# Patient Record
Sex: Male | Born: 1950
Health system: Southern US, Community
[De-identification: ages and names within clinical notes are randomized; demographics above are authoritative.]

## PROBLEM LIST (undated history)

## (undated) DIAGNOSIS — E785 Hyperlipidemia, unspecified: Secondary | ICD-10-CM

## (undated) DIAGNOSIS — T782XXA Anaphylactic shock, unspecified, initial encounter: Secondary | ICD-10-CM

## (undated) DIAGNOSIS — C439 Malignant melanoma of skin, unspecified: Secondary | ICD-10-CM

## (undated) HISTORY — DX: Malignant melanoma of skin, unspecified: C43.9

## (undated) HISTORY — PX: MELANOMA EXCISION: SHX5266

## (undated) HISTORY — PX: THYROID SURGERY: SHX805

---

## 2013-02-12 ENCOUNTER — Encounter (HOSPITAL_COMMUNITY): Payer: Self-pay | Admitting: *Deleted

## 2013-02-12 ENCOUNTER — Emergency Department (HOSPITAL_COMMUNITY)
Admission: EM | Admit: 2013-02-12 | Discharge: 2013-02-12 | Disposition: A | Discharge status: Home / Self Care | Payer: BC Managed Care – PPO | Attending: Emergency Medicine | Admitting: Emergency Medicine

## 2013-02-12 DIAGNOSIS — T63461A Toxic effect of venom of wasps, accidental (unintentional), initial encounter: Secondary | ICD-10-CM | POA: Insufficient documentation

## 2013-02-12 DIAGNOSIS — Y929 Unspecified place or not applicable: Secondary | ICD-10-CM | POA: Insufficient documentation

## 2013-02-12 DIAGNOSIS — Y939 Activity, unspecified: Secondary | ICD-10-CM | POA: Insufficient documentation

## 2013-02-12 DIAGNOSIS — Z862 Personal history of diseases of the blood and blood-forming organs and certain disorders involving the immune mechanism: Secondary | ICD-10-CM | POA: Insufficient documentation

## 2013-02-12 DIAGNOSIS — R6889 Other general symptoms and signs: Secondary | ICD-10-CM | POA: Insufficient documentation

## 2013-02-12 DIAGNOSIS — Z8639 Personal history of other endocrine, nutritional and metabolic disease: Secondary | ICD-10-CM | POA: Insufficient documentation

## 2013-02-12 DIAGNOSIS — T7840XA Allergy, unspecified, initial encounter: Secondary | ICD-10-CM

## 2013-02-12 DIAGNOSIS — I10 Essential (primary) hypertension: Secondary | ICD-10-CM | POA: Insufficient documentation

## 2013-02-12 DIAGNOSIS — T783XXA Angioneurotic edema, initial encounter: Secondary | ICD-10-CM | POA: Insufficient documentation

## 2013-02-12 DIAGNOSIS — Z79899 Other long term (current) drug therapy: Secondary | ICD-10-CM | POA: Insufficient documentation

## 2013-02-12 HISTORY — DX: Anaphylactic shock, unspecified, initial encounter: T78.2XXA

## 2013-02-12 HISTORY — DX: Hyperlipidemia, unspecified: E78.5

## 2013-02-12 MED ORDER — DIPHENHYDRAMINE HCL 25 MG PO CAPS
50.0000 mg | ORAL_CAPSULE | Freq: Once | ORAL | Status: AC
  Administered 2013-02-12: 50 mg via ORAL
  Filled 2013-02-12: qty 2

## 2013-02-12 MED ORDER — DIPHENHYDRAMINE HCL 25 MG PO TABS: 50.0000 mg | ORAL_TABLET | Freq: Three times a day (TID) | ORAL | Status: DC

## 2013-02-12 MED ORDER — RANITIDINE HCL 150 MG PO CAPS: 150.0000 mg | ORAL_CAPSULE | Freq: Two times a day (BID) | ORAL | Status: DC

## 2013-02-12 MED ORDER — EPINEPHRINE 0.3 MG/0.3ML IJ DEVI: 0.3000 mg | INTRAMUSCULAR | Status: AC | PRN

## 2013-02-12 MED ORDER — PREDNISONE 50 MG PO TABS: 50.0000 mg | ORAL_TABLET | Freq: Every day | ORAL | Status: DC

## 2013-02-12 MED ORDER — RANITIDINE HCL 150 MG/10ML PO SYRP
150.0000 mg | ORAL_SOLUTION | Freq: Once | ORAL | Status: AC
  Administered 2013-02-12: 150 mg via ORAL
  Filled 2013-02-12 (×2): qty 10

## 2013-02-12 NOTE — ED Provider Notes (Signed)
History     CSN: [id number]  Arrival date & time 02/12/13  0825   First MD Initiated Contact with Patient 02/12/13 [phone]      Chief Complaint  Patient presents with  . Angioedema    (Consider location/radiation/quality/duration/timing/severity/associated sxs/prior treatment) HPI Comments: 57 y M with PMH of anaphylaxis (prior workup by an Allergist with multiple allergens identified, prior Rx for epipen, none now) here for lower lip swelling and subjective throat swelling that started 2 days ago.  He had a bug bite on Saturday with localized itching/redness, but no other known exposures.  Pt is unsure of anything else that could have caused his symptoms.  His only medication is Lipitor and OTC vitamins.  No rash, wheezing, drooling, difficulty swallowing, nausea, vomiting, diarrhea or other complaints.   Patient is a 62 y.o. male presenting with allergic reaction. The history is provided by the patient.  Allergic Reaction The primary symptoms are  angioedema. The primary symptoms do not include wheezing, shortness of breath, abdominal pain, nausea, vomiting, diarrhea, palpitations, rash or urticaria. The current episode started 2 days ago. The problem has been gradually worsening. This is a new problem.  The angioedema is not associated with shortness of breath.  The onset of the reaction was associated with insect bite/sting. Significant symptoms also include itching (in area of insect bite). Significant symptoms that are not present include eye redness or rhinorrhea.    Past Medical History  Diagnosis Date  . Anaphylaxis   . Hyperlipidemia     History reviewed. No pertinent past surgical history.  History reviewed. No pertinent family history.  History  Substance Use Topics  . Smoking status: Never Smoker   . Smokeless tobacco: Never Used  . Alcohol Use: No      Review of Systems  Constitutional: Negative for fever.  HENT: Negative for rhinorrhea and trouble swallowing.    Eyes: Negative for redness.  Respiratory: Negative for shortness of breath and wheezing.   Cardiovascular: Negative for chest pain and palpitations.  Gastrointestinal: Negative for nausea, vomiting, abdominal pain and diarrhea.  Skin: Positive for itching (in area of insect bite). Negative for rash.  All other systems reviewed and are negative.    Allergies  Review of patient's allergies indicates no known allergies.  Home Medications   Current Outpatient Rx  Name  Route  Sig  Dispense  Refill  . diphenhydrAMINE (BENADRYL) 25 MG tablet   Oral   Take 2 tablets (50 mg total) by mouth every 8 (eight) hours. For 3 days   18 tablet   0   . EPINEPHrine (EPIPEN) 0.3 mg/0.3 mL DEVI   Intramuscular   Inject 0.3 mLs (0.3 mg total) into the muscle as needed (anaphylaxis).   2 Device   1   . predniSONE (DELTASONE) 50 MG tablet   Oral   Take 1 tablet (50 mg total) by mouth daily.   5 tablet   0   . ranitidine (ZANTAC) 150 MG capsule   Oral   Take 1 capsule (150 mg total) by mouth 2 (two) times daily. For 3 days   6 capsule   0     BP 133/96  Pulse 71  Temp(Src) 97.5 F (36.4 C) (Oral)  Resp 16  Ht 5\' 9"  (1.753 m)  Wt 170 lb (77.111 kg)  BMI 25.09 kg/m2  SpO2 99%  Physical Exam  Vitals reviewed. Constitutional: He is oriented to person, place, and time. He appears well-developed and well-nourished. No distress.  HENT:  Head: Normocephalic. No trismus in the jaw.  Right Ear: External ear normal.  Left Ear: External ear normal.  Nose: Nose normal.  Mouth/Throat: Uvula is midline and oropharynx is clear and moist. No edematous. No oropharyngeal exudate, posterior oropharyngeal edema or posterior oropharyngeal erythema.    No tongue swelling  Eyes: Conjunctivae and EOM are normal. Pupils are equal, round, and reactive to light.  Neck: Normal range of motion. Neck supple.  Cardiovascular: Normal rate, regular rhythm, normal heart sounds and intact distal pulses.  Exam  reveals no gallop and no friction rub.   No murmur heard. Pulmonary/Chest: Effort normal and breath sounds normal.  Abdominal: Soft. Bowel sounds are normal. He exhibits no distension. There is no tenderness.  Musculoskeletal: Normal range of motion. He exhibits no edema and no tenderness.       Back:  Neurological: He is alert and oriented to person, place, and time. No cranial nerve deficit.  Skin: Skin is warm and dry.  Psychiatric: He has a normal mood and affect.    ED Course  Procedures (including critical care time)  Labs Reviewed - No data to display No results found.   1. Angioedema of lips, initial encounter   2. Allergic reaction, initial encounter   3. Insect sting allergy, current reaction, initial encounter       MDM   7 y M with PMH of anaphylaxis (prior workup by an Allergist with multiple allergens identified, prior Rx for epipen, none now) here for lower lip swelling and subjective throat swelling.  He had a bug bite on Saturday with localized itching/redness, but no other known exposures.  Pt is unsure of anything else that could have caused his symptoms.  His only medication is Lipitor and OTC vitamins.  No rash, wheezing, drooling, difficulty swallowing, nausea, vomiting, diarrhea or other complaints.  AFVSS.  Mild lower lip edemam > on L.  No tongue, uvula, submental or posterior oropharyngeal swelling appreciated.  Lungs clear.  Blanchable erythematous area on left lower back with raised central area c/w insect bite.  Presentation c/w allergic reaction.  Benadryl and Zantac here with Rx's for the same.  Rx for epipen - administration technique and indications discussed.  Pt has a PCP appt next week.  Return precautions reviewed.  It is felt the pt is stable for d/c.  All questions answered and patient expressed understanding.  Disposition: Discharge  Condition: Good      Follow-up Information   Follow up with your doctor as discussed..      Follow up  with [facility] EMERGENCY DEPARTMENT. (If symptoms worsen)    Contact information:   [address] [id number] [place] [address] [phone]        Medication List    TAKE these medications       diphenhydrAMINE 25 MG tablet  Commonly known as:  BENADRYL  Take 2 tablets (50 mg total) by mouth every 8 (eight) hours. For 3 days     EPINEPHrine 0.3 mg/0.3 mL Devi  Commonly known as:  EPIPEN  Inject 0.3 mLs (0.3 mg total) into the muscle as needed (anaphylaxis).     predniSONE 50 MG tablet  Commonly known as:  DELTASONE  Take 1 tablet (50 mg total) by mouth daily.     ranitidine 150 MG capsule  Commonly known as:  ZANTAC  Take 1 capsule (150 mg total) by mouth 2 (two) times daily. For 3 days  Pt seen in conjunction with my attending, Dr. Silverio Lay.   Oleh Genin, MD PGY-II [facility] Emergency Medicine Resident   Oleh Genin, MD 02/12/13 [phone]

## 2013-02-12 NOTE — ED Notes (Signed)
Pt d/c talking in complete sentences. NDN.

## 2013-02-12 NOTE — ED Provider Notes (Signed)
I have supervised the resident on the management of this patient and agree with the note above. I personally interviewed and examined the patient and my addendum is below.   Carl Dunn is a 62 y.o. male hx of anaphylaxis here with lower lip swelling. Had spider bite several days ago. But yesterday had lower lip swelling. Complains of fullness in throat but no throat closing or SOB or drooling. Not stridorous in the ED, soft palate and uvula not swollen. I told him to take benadryl around the clock. If not improved by later today, he can take prednisone for 5 days. Prescribed epipen for anaphylactic symptoms. Return precautions given.    Richardean Canal, MD 02/12/13 1355

## 2013-02-12 NOTE — ED Notes (Signed)
Pt reports that yesterday he began to have lower lip swelling.  No distress noted.  He reports that the swelling has increased to now include his throat (sensation of throat tightness).  He is able to talk in complete sentences.  Resp symmetrical and unlabored.  Skin warm and dry.  Pt does not sound hoarse.  Denies SOB.

## 2013-04-22 ENCOUNTER — Ambulatory Visit (INDEPENDENT_AMBULATORY_CARE_PROVIDER_SITE_OTHER): Payer: Self-pay | Admitting: Surgery

## 2013-04-27 ENCOUNTER — Telehealth (INDEPENDENT_AMBULATORY_CARE_PROVIDER_SITE_OTHER): Payer: Self-pay

## 2013-04-27 NOTE — Telephone Encounter (Signed)
LMOM at pt home # to call to r/s missed appt. LMOM for [place] at [place]. Med. That pt n/s appt.

## 2013-05-01 ENCOUNTER — Encounter (INDEPENDENT_AMBULATORY_CARE_PROVIDER_SITE_OTHER): Payer: Self-pay | Admitting: Surgery

## 2013-07-16 ENCOUNTER — Ambulatory Visit: Payer: BC Managed Care – PPO | Admitting: Internal Medicine

## 2013-08-25 ENCOUNTER — Ambulatory Visit: Payer: BC Managed Care – PPO | Admitting: Internal Medicine

## 2013-12-25 ENCOUNTER — Other Ambulatory Visit (HOSPITAL_COMMUNITY): Payer: Self-pay | Admitting: Urology

## 2013-12-25 DIAGNOSIS — R972 Elevated prostate specific antigen [PSA]: Secondary | ICD-10-CM

## 2013-12-27 DIAGNOSIS — E892 Postprocedural hypoparathyroidism: Secondary | ICD-10-CM | POA: Insufficient documentation

## 2014-01-11 ENCOUNTER — Ambulatory Visit (HOSPITAL_COMMUNITY)
Admission: RE | Admit: 2014-01-11 | Discharge: 2014-01-11 | Disposition: A | Discharge status: Home / Self Care | Payer: BC Managed Care – PPO | Source: Ambulatory Visit | Attending: Urology | Admitting: Urology

## 2014-01-11 DIAGNOSIS — N400 Enlarged prostate without lower urinary tract symptoms: Secondary | ICD-10-CM | POA: Insufficient documentation

## 2014-01-11 DIAGNOSIS — R972 Elevated prostate specific antigen [PSA]: Secondary | ICD-10-CM | POA: Insufficient documentation

## 2014-01-11 DIAGNOSIS — K4090 Unilateral inguinal hernia, without obstruction or gangrene, not specified as recurrent: Secondary | ICD-10-CM | POA: Insufficient documentation

## 2014-01-11 MED ORDER — GADOBENATE DIMEGLUMINE 529 MG/ML IV SOLN
16.0000 mL | Freq: Once | INTRAVENOUS | Status: AC | PRN
  Administered 2014-01-11: 16 mL via INTRAVENOUS

## 2014-01-12 LAB — POCT I-STAT CREATININE: CREATININE: 1.3 mg/dL (ref 0.50–1.35)

## 2015-02-08 DIAGNOSIS — W57XXXA Bitten or stung by nonvenomous insect and other nonvenomous arthropods, initial encounter: Secondary | ICD-10-CM | POA: Insufficient documentation

## 2015-02-08 DIAGNOSIS — E042 Nontoxic multinodular goiter: Secondary | ICD-10-CM | POA: Insufficient documentation

## 2015-02-08 DIAGNOSIS — M8580 Other specified disorders of bone density and structure, unspecified site: Secondary | ICD-10-CM | POA: Insufficient documentation

## 2015-02-08 DIAGNOSIS — E213 Hyperparathyroidism, unspecified: Secondary | ICD-10-CM | POA: Insufficient documentation

## 2015-06-09 ENCOUNTER — Other Ambulatory Visit: Payer: Self-pay | Admitting: Urology

## 2015-06-09 DIAGNOSIS — E291 Testicular hypofunction: Secondary | ICD-10-CM

## 2015-07-08 ENCOUNTER — Other Ambulatory Visit: Payer: Self-pay | Admitting: Urology

## 2015-07-08 DIAGNOSIS — D751 Secondary polycythemia: Secondary | ICD-10-CM

## 2015-08-02 ENCOUNTER — Encounter (HOSPITAL_COMMUNITY): Admission: RE | Admit: 2015-08-02 | Payer: BLUE CROSS/BLUE SHIELD | Source: Ambulatory Visit

## 2015-09-15 ENCOUNTER — Emergency Department (HOSPITAL_COMMUNITY)
Admission: EM | Admit: 2015-09-15 | Discharge: 2015-09-15 | Disposition: A | Discharge status: Home / Self Care | Payer: BLUE CROSS/BLUE SHIELD | Attending: Emergency Medicine | Admitting: Emergency Medicine

## 2015-09-15 ENCOUNTER — Encounter (HOSPITAL_COMMUNITY): Payer: Self-pay | Admitting: Emergency Medicine

## 2015-09-15 DIAGNOSIS — Y998 Other external cause status: Secondary | ICD-10-CM | POA: Diagnosis not present

## 2015-09-15 DIAGNOSIS — Z7952 Long term (current) use of systemic steroids: Secondary | ICD-10-CM | POA: Insufficient documentation

## 2015-09-15 DIAGNOSIS — Y9289 Other specified places as the place of occurrence of the external cause: Secondary | ICD-10-CM | POA: Insufficient documentation

## 2015-09-15 DIAGNOSIS — Y9301 Activity, walking, marching and hiking: Secondary | ICD-10-CM | POA: Diagnosis not present

## 2015-09-15 DIAGNOSIS — Z79899 Other long term (current) drug therapy: Secondary | ICD-10-CM | POA: Diagnosis not present

## 2015-09-15 DIAGNOSIS — E785 Hyperlipidemia, unspecified: Secondary | ICD-10-CM | POA: Insufficient documentation

## 2015-09-15 DIAGNOSIS — W228XXA Striking against or struck by other objects, initial encounter: Secondary | ICD-10-CM | POA: Insufficient documentation

## 2015-09-15 DIAGNOSIS — Z23 Encounter for immunization: Secondary | ICD-10-CM | POA: Diagnosis not present

## 2015-09-15 DIAGNOSIS — S0501XA Injury of conjunctiva and corneal abrasion without foreign body, right eye, initial encounter: Secondary | ICD-10-CM | POA: Diagnosis not present

## 2015-09-15 DIAGNOSIS — S0591XA Unspecified injury of right eye and orbit, initial encounter: Secondary | ICD-10-CM | POA: Diagnosis present

## 2015-09-15 MED ORDER — POLYMYXIN B-TRIMETHOPRIM 10000-0.1 UNIT/ML-% OP SOLN
1.0000 [drp] | OPHTHALMIC | Status: DC
  Administered 2015-09-15: 1 [drp] via OPHTHALMIC
  Filled 2015-09-15: qty 10

## 2015-09-15 MED ORDER — FLUORESCEIN SODIUM 1 MG OP STRP
1.0000 | ORAL_STRIP | Freq: Once | OPHTHALMIC | Status: AC
  Administered 2015-09-15: 1 via OPHTHALMIC
  Filled 2015-09-15: qty 1

## 2015-09-15 MED ORDER — TETANUS-DIPHTH-ACELL PERTUSSIS 5-2.5-18.5 LF-MCG/0.5 IM SUSP
0.5000 mL | Freq: Once | INTRAMUSCULAR | Status: AC
  Administered 2015-09-15: 0.5 mL via INTRAMUSCULAR
  Filled 2015-09-15: qty 0.5

## 2015-09-15 MED ORDER — IBUPROFEN 400 MG PO TABS: 400.0000 mg | ORAL_TABLET | Freq: Four times a day (QID) | ORAL | Status: DC | PRN

## 2015-09-15 MED ORDER — TETRACAINE HCL 0.5 % OP SOLN
2.0000 [drp] | Freq: Once | OPHTHALMIC | Status: AC
  Administered 2015-09-15: 2 [drp] via OPHTHALMIC
  Filled 2015-09-15: qty 2

## 2015-09-15 NOTE — ED Notes (Signed)
See PA note.

## 2015-09-15 NOTE — Discharge Instructions (Signed)
Apply 1-2 drops of antibiotic eye drop to the affected eye every 4-6 hrs while awake for the next 5 days.  Follow up with eye specialist if you have any concerns.  Take ibuprofen as needed for pain.  Corneal Abrasion The cornea is the clear covering at the front and center of the eye. When looking at the colored portion of the eye (iris), you are looking through the cornea. This very thin tissue is made up of many layers. The surface layer is a single layer of cells (corneal epithelium) and is one of the most sensitive tissues in the body. If a scratch or injury causes the corneal epithelium to come off, it is called a corneal abrasion. If the injury extends to the tissues below the epithelium, the condition is called a corneal ulcer. CAUSES   Scratches.  Trauma.  Foreign body in the eye. Some people have recurrences of abrasions in the area of the original injury even after it has healed (recurrent erosion syndrome). Recurrent erosion syndrome generally improves and goes away with time. SYMPTOMS   Eye pain.  Difficulty or inability to keep the injured eye open.  The eye becomes very sensitive to light.  Recurrent erosions tend to happen suddenly, first thing in the morning, usually after waking up and opening the eye. DIAGNOSIS  Your health care provider can diagnose a corneal abrasion during an eye exam. Dye is usually placed in the eye using a drop or a small paper strip moistened by your tears. When the eye is examined with a special light, the abrasion shows up clearly because of the dye. TREATMENT   Small abrasions may be treated with antibiotic drops or ointment alone.  A pressure patch may be put over the eye. If this is done, follow your doctor's instructions for when to remove the patch. Do not drive or use machines while the eye patch is on. Judging distances is hard to do with a patch on. If the abrasion becomes infected and spreads to the deeper tissues of the cornea, a  corneal ulcer can result. This is serious because it can cause corneal scarring. Corneal scars interfere with light passing through the cornea and cause a loss of vision in the involved eye. HOME CARE INSTRUCTIONS  Use medicine or ointment as directed. Only take over-the-counter or prescription medicines for pain, discomfort, or fever as directed by your health care provider.  Do not drive or operate machinery if your eye is patched. Your ability to judge distances is impaired.  If your health care provider has given you a follow-up appointment, it is very important to keep that appointment. Not keeping the appointment could result in a severe eye infection or permanent loss of vision. If there is any problem keeping the appointment, let your health care provider know. SEEK MEDICAL CARE IF:   You have pain, light sensitivity, and a scratchy feeling in one eye or both eyes.  Your pressure patch keeps loosening up, and you can blink your eye under the patch after treatment.  Any kind of discharge develops from the eye after treatment or if the lids stick together in the morning.  You have the same symptoms in the morning as you did with the original abrasion days, weeks, or months after the abrasion healed.   This information is not intended to replace advice given to you by your health care provider. Make sure you discuss any questions you have with your health care provider.   Document Released:  10/05/2000 Document Revised: 06/29/2015 Document Reviewed: 06/15/2013 Elsevier Interactive Patient Education [employer].

## 2015-09-15 NOTE — ED Notes (Signed)
Pt from home, states he was walking a dog when he walked into a branch from a tree and hit his eye. Pt states he kept blinking and vision became more clear but reports that something is in his eye. Pt with full movement to eyeball. nad noted.

## 2015-09-15 NOTE — ED Provider Notes (Signed)
CSN: [id number]     Arrival date & time 09/15/15  1044 History  By signing my name below, I, Eustaquio Maize, attest that this documentation has been prepared under the direction and in the presence of Domenic Moras, PA-C. Electronically Signed: Eustaquio Maize, ED Scribe. 09/15/2015. 11:09 AM.  No chief complaint on file.  The history is provided by the patient. No language interpreter was used.     HPI Comments: Carl Dunn is a 64 y.o. male who presents to the Emergency Department complaining of sudden onset right eye injury that occurred this morning. Pt was walking his dog outside and was not wearing his glasses when he walked into a branch. He notes immediate blurry vision to his right eye and a sensation that something was in it. Pt washed his eye out with water which mildly alleviated the blurry vision. He still feels like these is something in it but states it feel better than it did prior to washing it out. Pt reports only mild discomfort to the right eye but denies any pain. Denies loss of vision or any other associated symptoms. Pt is non contact wearer. Tetanus not up to date.   Past Medical History  Diagnosis Date  . Anaphylaxis   . Hyperlipidemia    No past surgical history on file. No family history on file. Social History  Substance Use Topics  . Smoking status: Never Smoker   . Smokeless tobacco: Never Used  . Alcohol Use: No    Review of Systems  Eyes: Positive for visual disturbance (Blurry vision). Negative for pain and discharge.       + Right eye discomfort  Neurological: Negative for dizziness and light-headedness.    Allergies  Review of patient's allergies indicates no known allergies.  Home Medications   Prior to Admission medications   Medication Sig Start Date End Date Taking? Authorizing Provider  Ascorbic Acid (VITAMIN C PO) Take 1 tablet by mouth daily.    Historical Provider, MD  atorvastatin (LIPITOR) 20 MG tablet Take 20 mg by mouth every  evening.    Historical Provider, MD  B Complex Vitamins (VITAMIN B COMPLEX PO) Take 1 tablet by mouth daily.    Historical Provider, MD  Cholecalciferol (VITAMIN D PO) Take 1 tablet by mouth daily.    Historical Provider, MD  co-enzyme Q-10 30 MG capsule Take 30 mg by mouth daily.    Historical Provider, MD  Cyanocobalamin (VITAMIN B-12 PO) Take 1 tablet by mouth daily.    Historical Provider, MD  diphenhydrAMINE (BENADRYL) 25 MG tablet Take 2 tablets (50 mg total) by mouth every 8 (eight) hours. For 3 days 02/12/13   Madaline Brilliant, MD  EPINEPHrine (EPIPEN) 0.3 mg/0.3 mL DEVI Inject 0.3 mLs (0.3 mg total) into the muscle as needed (anaphylaxis). 02/12/13   Madaline Brilliant, MD  levothyroxine (SYNTHROID, LEVOTHROID) 50 MCG tablet Take 50 mcg by mouth daily before breakfast.    Historical Provider, MD  MAGNESIUM PO Take 1 tablet by mouth daily.    Historical Provider, MD  Multiple Vitamins-Minerals (ZINC PO) Take 1 tablet by mouth daily.    Historical Provider, MD  Omega-3 Fatty Acids (OMEGA 3 PO) Take 1 capsule by mouth daily.    Historical Provider, MD  predniSONE (DELTASONE) 50 MG tablet Take 1 tablet (50 mg total) by mouth daily. 02/12/13   Madaline Brilliant, MD  ranitidine (ZANTAC) 150 MG capsule Take 1 capsule (150 mg total) by mouth 2 (two) times daily. For 3 days 02/12/13  Madaline Brilliant, MD   Triage Vitals: BP 110/80 mmHg  Pulse 87  Temp(Src) 97.7 F (36.5 C) (Oral)  Resp 16  Ht 5\' 10"  (1.778 m)  Wt 195 lb (88.451 kg)  BMI 27.98 kg/m2  SpO2 96%   Physical Exam  Constitutional: He is oriented to person, place, and time. He appears well-developed and well-nourished. No distress.  HENT:  Head: Normocephalic and atraumatic.  Eyes: EOM and lids are normal. Pupils are equal, round, and reactive to light. Lids are everted and swept, no foreign bodies found. Right eye exhibits no chemosis, no discharge, no exudate and no hordeolum. No foreign body present in the right eye. Right conjunctiva is injected.   Slit lamp exam:      The right eye shows corneal abrasion and fluorescein uptake. The right eye shows no corneal flare, no corneal ulcer, no foreign body, no hyphema and no hypopyon.    Right lower eyelid has a small abrasion noted No obvious foreign object  Neck: Neck supple. No tracheal deviation present.  Cardiovascular: Normal rate.   Pulmonary/Chest: Effort normal. No respiratory distress.  Musculoskeletal: Normal range of motion.  Neurological: He is alert and oriented to person, place, and time.  Skin: Skin is warm and dry.  Psychiatric: He has a normal mood and affect. His behavior is normal.  Nursing note and vitals reviewed.   ED Course  Procedures (including critical care time)  DIAGNOSTIC STUDIES: Oxygen Saturation is 96% on RA, normal by my interpretation.    COORDINATION OF CARE: 11:06 AM-Discussed treatment plan which includes tetracaine eye drops and fluorescein strip test with pt at bedside and pt agreed to plan.   R eye injury.  Evidence of corneal abrasion. Normal visual acuity.  Ocular [place] without evidence of fb or retinal detachment. Will update tdap, give polytrim, provide eye care instruction.    EMERGENCY DEPARTMENT [place] OCULAR EXAM "Study: Limited Ultrasound of Orbit "  INDICATIONS: Traumatic  Linear probe utilized to obtain images in both long and short axis of the orbit having the patient look left and right if possible.  PERFORMED BY: Myself  IMAGES ARCHIVED?: Yes  LIMITATIONS: none  VIEWS USED: Right orbit  INTERPRETATION: No retinal detachment, Lens in proper position   Labs Review Labs Reviewed - No data to display  Imaging Review No results found.    EKG Interpretation None      MDM   Final diagnoses:  Corneal abrasion, right, initial encounter   BP 110/80 mmHg  Pulse 87  Temp(Src) 97.7 F (36.5 C) (Oral)  Resp 16  Ht 5\' 10"  (1.778 m)  Wt 88.451 kg  BMI 27.98 kg/m2  SpO2 96%   I personally performed the services  described in this documentation, which was scribed in my presence. The recorded information has been reviewed and is accurate.        Domenic Moras, PA-C 09/15/15 Ryan, MD 09/15/15 1340

## 2015-09-28 DIAGNOSIS — D3131 Benign neoplasm of right choroid: Secondary | ICD-10-CM | POA: Insufficient documentation

## 2016-10-18 ENCOUNTER — Institutional Professional Consult (permissible substitution): Payer: BLUE CROSS/BLUE SHIELD | Admitting: Internal Medicine

## 2016-10-23 ENCOUNTER — Encounter: Payer: Self-pay | Admitting: Cardiology

## 2016-10-23 ENCOUNTER — Ambulatory Visit (INDEPENDENT_AMBULATORY_CARE_PROVIDER_SITE_OTHER): Payer: BLUE CROSS/BLUE SHIELD | Admitting: Cardiology

## 2016-10-23 VITALS — BP 104/90 | HR 74 | Ht 70.0 in | Wt 203.8 lb

## 2016-10-23 DIAGNOSIS — Z79899 Other long term (current) drug therapy: Secondary | ICD-10-CM | POA: Diagnosis not present

## 2016-10-23 DIAGNOSIS — R000 Tachycardia, unspecified: Secondary | ICD-10-CM | POA: Diagnosis not present

## 2016-10-23 NOTE — Patient Instructions (Signed)
Medication Instructions: Your physician recommends that you continue on your current medications as directed. Please refer to the Current Medication list given to you today.   Labwork: Your physician recommends that you have lab work : CBC and BMET   Procedures/Testing: None Ordered  Follow-Up: Your physician wants you to follow-up in: 6 MONTHS with Dr. Curt Bears. You will receive a reminder letter in the mail two months in advance. If you don't receive a letter, please call our office to schedule the follow-up appointment.   Any Additional Special Instructions Will Be Listed Below (If Applicable).     If you need a refill on your cardiac medications before your next appointment, please call your pharmacy.

## 2016-10-23 NOTE — Progress Notes (Signed)
Electrophysiology Office Note   Date:  10/23/2016   ID:  Carl Dunn, DOB 05/10/1951, MRN [id number]  PCP:  No PCP Per Patient  Primary Electrophysiologist:  Willodean Leven Meredith Leeds, MD    Chief Complaint  Patient presents with  . Palpitations    History of Present Illness: Carl Dunn is a 66 y.o. male who presents today for electrophysiology evaluation.   He has a history of hyperlipidemia, Hyperparathyroidism status post resection. He was on a bike riding in [place] on December 27. After the bike ride, he says that he developed episodes of palpitations. He waited 2 hours prior to going to the emergency room and tried multiple maneuvers such as lying down and elevating his legs. He had associated dizziness and nausea. Once he got into the emergency room, he was given 6 mg of adenosine which resulted in cessation of his tachycardia. He has had one other episode of tachycardia in the past that was not as long lived. He otherwise has felt well without any complaint. Further episodes.   Today, he denies symptoms of palpitations, chest pain, shortness of breath, orthopnea, PND, lower extremity edema, claudication, dizziness, presyncope, syncope, bleeding, or neurologic sequela. The patient is tolerating medications without difficulties and is otherwise without complaint today.    Past Medical History:  Diagnosis Date  . Anaphylaxis   . Hyperlipidemia    Past Surgical History:  Procedure Laterality Date  . THYROID SURGERY       Current Outpatient Prescriptions  Medication Sig Dispense Refill  . Ascorbic Acid (VITAMIN C PO) Take 1 tablet by mouth daily.    Marland Kitchen atorvastatin (LIPITOR) 20 MG tablet Take 20 mg by mouth every evening.    . B Complex Vitamins (VITAMIN B COMPLEX PO) Take 1 tablet by mouth daily.    . Cholecalciferol (VITAMIN D PO) Take 1 tablet by mouth daily.    Marland Kitchen co-enzyme Q-10 30 MG capsule Take 30 mg by mouth daily.    . Cyanocobalamin (VITAMIN B-12 PO) Take 1 tablet  by mouth daily.    Marland Kitchen EPINEPHrine (EPIPEN) 0.3 mg/0.3 mL DEVI Inject 0.3 mLs (0.3 mg total) into the muscle as needed (anaphylaxis). 2 Device 1  . MAGNESIUM PO Take 1 tablet by mouth daily.    . Multiple Vitamins-Minerals (ZINC PO) Take 1 tablet by mouth daily.    . Omega-3 Fatty Acids (OMEGA 3 PO) Take 1 capsule by mouth daily.     No current facility-administered medications for this visit.     Allergies:   Patient has no known allergies.   Social History:  The patient  reports that he has never smoked. He has never used smokeless tobacco. He reports that he does not drink alcohol or use drugs.   Family History:  The patient's family history includes Colon cancer in his father; Heart attack in his paternal grandfather; Hyperlipidemia in his father; Hypoparathyroidism in his mother.    ROS:  Please see the history of present illness.   Otherwise, review of systems is positive for patient's, dyspnea on exertion.   All other systems are reviewed and negative.    PHYSICAL EXAM: VS:  BP 104/90   Pulse 74   Ht 5\' 10"  (1.778 m)   Wt 203 lb 12.8 oz (92.4 kg)   SpO2 98%   BMI 29.24 kg/m  , BMI Body mass index is 29.24 kg/m. GEN: Well nourished, well developed, in no acute distress  HEENT: normal  Neck: no JVD, carotid bruits, or masses  Cardiac: RRR; no murmurs, rubs, or gallops,no edema  Respiratory:  clear to auscultation bilaterally, normal work of breathing GI: soft, nontender, nondistended, + BS MS: no deformity or atrophy  Skin: warm and dry Neuro:  Strength and sensation are intact Psych: euthymic mood, full affect  EKG:  EKG is ordered today. Personal review of the ekg ordered shows sinus rhythm first. AV block, PR 216  Recent Labs: No results found for requested labs within last 8760 hours.    Lipid Panel  No results found for: CHOL, TRIG, HDL, CHOLHDL, VLDL, LDLCALC, LDLDIRECT   Wt Readings from Last 3 Encounters:  10/23/16 203 lb 12.8 oz (92.4 kg)  09/15/15 195  lb (88.5 kg)  02/12/13 170 lb (77.1 kg)      Other studies Reviewed: Additional studies/ records that were reviewed today include: ER ECGs  Review of the above records today demonstrates:  SVT rate 156 short RP tachycardia   ASSESSMENT AND PLAN:  1.  SVT: Responded to adenosine 6 mg. Not had any further episodes of tachycardia. 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 We did discuss options of therapy including medications as well as ablation. He says that since he has been feeling well without major complaint, he would prefer to have a watchful waiting approach. I have told him of vagal maneuvers that may help to treat his tachycardia.  2. Hyperlipidemia: Continue atorvastatin  3. Elevated creatinine, elevated white blood cell count: His labs were abnormal when he was in the emergency room in [place]. We Lorriane Dehart recheck today and referred to primary care if remains abnormal.   Current medicines are reviewed at length with the patient today.   The patient does not have concerns regarding his medicines.  The following changes were made today:  none  Labs/ tests ordered today include:  Orders Placed This Encounter  Procedures  . CBC w/Diff  . Basic Metabolic Panel (BMET)  . EKG 12-Lead     Disposition:   FU with Kanen Mottola 6 months  Signed, Jase Himmelberger Meredith Leeds, MD  10/23/2016 9:38 AM     [facility] [address] [place] [address] [phone] (office) [phone] (fax)

## 2016-10-24 LAB — BASIC METABOLIC PANEL
BUN/Creatinine Ratio: 15 (ref 10–24)
BUN: 18 mg/dL (ref 8–27)
CALCIUM: 9 mg/dL (ref 8.6–10.2)
CO2: 23 mmol/L (ref 18–29)
CREATININE: 1.22 mg/dL (ref 0.76–1.27)
Chloride: 104 mmol/L (ref 96–106)
GFR, EST AFRICAN AMERICAN: 72 mL/min/{1.73_m2} (ref >59)
GFR, EST NON AFRICAN AMERICAN: 62 mL/min/{1.73_m2} (ref >59)
Glucose: 99 mg/dL (ref 65–99)
POTASSIUM: 4.7 mmol/L (ref 3.5–5.2)
Sodium: 142 mmol/L (ref 134–144)

## 2016-10-24 LAB — CBC WITH DIFFERENTIAL/PLATELET
Basophils Absolute: 0 10*3/uL (ref 0.0–0.2)
Basos: 1 %
EOS (ABSOLUTE): 0.2 10*3/uL (ref 0.0–0.4)
EOS: 2 %
HEMATOCRIT: 49 % (ref 37.5–51.0)
HEMOGLOBIN: 16.5 g/dL (ref 13.0–17.7)
IMMATURE GRANULOCYTES: 0 %
Immature Grans (Abs): 0 10*3/uL (ref 0.0–0.1)
Lymphocytes Absolute: 2.9 10*3/uL (ref 0.7–3.1)
Lymphs: 33 %
MCH: 31.1 pg (ref 26.6–33.0)
MCHC: 33.7 g/dL (ref 31.5–35.7)
MCV: 93 fL (ref 79–97)
MONOCYTES: 10 %
MONOS ABS: 0.9 10*3/uL (ref 0.1–0.9)
Neutrophils Absolute: 4.8 10*3/uL (ref 1.4–7.0)
Neutrophils: 54 %
Platelets: 340 10*3/uL (ref 150–379)
RBC: 5.3 x10E6/uL (ref 4.14–5.80)
RDW: 13.3 % (ref 12.3–15.4)
WBC: 8.9 10*3/uL (ref 3.4–10.8)

## 2017-05-15 ENCOUNTER — Ambulatory Visit (HOSPITAL_COMMUNITY): Payer: BLUE CROSS/BLUE SHIELD | Admitting: Psychiatry

## 2017-07-15 ENCOUNTER — Encounter: Payer: Self-pay | Admitting: Cardiology

## 2017-07-15 ENCOUNTER — Encounter (INDEPENDENT_AMBULATORY_CARE_PROVIDER_SITE_OTHER): Payer: Self-pay

## 2017-07-15 ENCOUNTER — Ambulatory Visit (INDEPENDENT_AMBULATORY_CARE_PROVIDER_SITE_OTHER): Payer: BLUE CROSS/BLUE SHIELD | Admitting: Cardiology

## 2017-07-15 VITALS — BP 110/78 | HR 94 | Ht 70.0 in | Wt 198.4 lb

## 2017-07-15 DIAGNOSIS — I471 Supraventricular tachycardia: Secondary | ICD-10-CM

## 2017-07-15 DIAGNOSIS — E782 Mixed hyperlipidemia: Secondary | ICD-10-CM | POA: Diagnosis not present

## 2017-07-15 MED ORDER — DILTIAZEM HCL 30 MG PO TABS: 30.0000 mg | ORAL_TABLET | Freq: Four times a day (QID) | ORAL | 1 refills | Status: DC | PRN

## 2017-07-15 NOTE — Progress Notes (Signed)
Electrophysiology Office Note   Date:  07/15/2017   ID:  Dash Cardarelli, DOB 1951/05/05, MRN [id number]  PCP:  Patient, No Pcp Per  Primary Electrophysiologist:  Lillian Ballester Meredith Leeds, MD    Chief Complaint  Patient presents with  . Follow-up    SVT    History of Present Illness: Odai Wimmer is a 66 y.o. male who presents today for electrophysiology evaluation.   He has a history of hyperlipidemia, Hyperparathyroidism status post resection. He was on a bike riding in [place] on December 27. After the bike ride, he says that he developed episodes of palpitations. He waited 2 hours prior to going to the emergency room and tried multiple maneuvers such as lying down and elevating his legs. He had associated dizziness and nausea. Once he got into the emergency room, he was given 6 mg of adenosine which resulted in cessation of his tachycardia. He presents today for follow-up of his SVT.  Today, denies symptoms of palpitations, chest pain, shortness of breath, orthopnea, PND, lower extremity edema, claudication, dizziness, presyncope, syncope, bleeding, or neurologic sequela. The patient is tolerating medications without difficulties. He has had one further episode of SVT that he says lasted approximately 30 minutes. He felt well other than the palpitations during this episode.    Past Medical History:  Diagnosis Date  . Anaphylaxis   . Hyperlipidemia    Past Surgical History:  Procedure Laterality Date  . THYROID SURGERY       Current Outpatient Prescriptions  Medication Sig Dispense Refill  . Ascorbic Acid (VITAMIN C PO) Take 1 tablet by mouth daily.    Marland Kitchen atorvastatin (LIPITOR) 20 MG tablet Take 20 mg by mouth every evening.    . B Complex Vitamins (VITAMIN B COMPLEX PO) Take 1 tablet by mouth daily.    . Cholecalciferol (VITAMIN D PO) Take 1 tablet by mouth daily.    Marland Kitchen co-enzyme Q-10 30 MG capsule Take 30 mg by mouth daily.    . Cyanocobalamin (VITAMIN B-12 PO) Take 1 tablet  by mouth daily.    Marland Kitchen EPINEPHrine (EPIPEN) 0.3 mg/0.3 mL DEVI Inject 0.3 mLs (0.3 mg total) into the muscle as needed (anaphylaxis). 2 Device 1  . MAGNESIUM PO Take 1 tablet by mouth daily.    . Multiple Vitamins-Minerals (ZINC PO) Take 1 tablet by mouth daily.    . Omega-3 Fatty Acids (OMEGA 3 PO) Take 1 capsule by mouth daily.     No current facility-administered medications for this visit.     Allergies:   Patient has no known allergies.   Social History:  The patient  reports that he has never smoked. He has never used smokeless tobacco. He reports that he does not drink alcohol or use drugs.   Family History:  The patient's family history includes Colon cancer in his father; Heart attack in his paternal grandfather; Hyperlipidemia in his father; Hypoparathyroidism in his mother.    ROS:  Please see the history of present illness.   Otherwise, review of systems is positive for Cough, palpitations, joint swelling.   All other systems are reviewed and negative.   PHYSICAL EXAM: VS:  BP 110/78   Pulse 94   Ht 5\' 10"  (1.778 m)   Wt 198 lb 6.4 oz (90 kg)   SpO2 98%   BMI 28.47 kg/m  , BMI Body mass index is 28.47 kg/m. GEN: Well nourished, well developed, in no acute distress  HEENT: normal  Neck: no JVD, carotid bruits, or  masses Cardiac: RRR; no murmurs, rubs, or gallops,no edema  Respiratory:  clear to auscultation bilaterally, normal work of breathing GI: soft, nontender, nondistended, + BS MS: no deformity or atrophy  Skin: warm and dry Neuro:  Strength and sensation are intact Psych: euthymic mood, full affect  EKG:  EKG is not ordered today. Personal review of the ekg ordered 10/23/16 shows sinus rhythm, first-degree AV block   Recent Labs: 10/23/2016: BUN 18; Creatinine, Ser 1.22; Hemoglobin 16.5; Platelets 340; Potassium 4.7; Sodium 142    Lipid Panel  No results found for: CHOL, TRIG, HDL, CHOLHDL, VLDL, LDLCALC, LDLDIRECT   Wt Readings from Last 3 Encounters:    07/15/17 198 lb 6.4 oz (90 kg)  10/23/16 203 lb 12.8 oz (92.4 kg)  09/15/15 195 lb (88.5 kg)      Other studies Reviewed: Additional studies/ records that were reviewed today include: ER ECGs  Review of the above records today demonstrates:  SVT rate 156 short RP tachycardia   ASSESSMENT AND PLAN:  1.  SVT: Has had tachycardia that responded to adenosine. Has had multiple episodes, one since hospitalization in [place]. Due to his episodes of tachycardia sporadically, [facility] plan to give him as needed diltiazem 30 mg.  2. Hyperlipidemia: Continue atorvastatin   Current medicines are reviewed at length with the patient today.   The patient does not have concerns regarding his medicines.  The following changes were made today:  diltiazem  Labs/ tests ordered today include:  No orders of the defined types were placed in this encounter.    Disposition:   FU with [facility] 12 months  Signed, Aneyah Lortz Meredith Leeds, MD  07/15/2017 12:16 PM     [address] [place]  [id number] [phone] (office) [phone] (fax)

## 2017-07-15 NOTE — Patient Instructions (Signed)
Medication Instructions:  Your physician has recommended you make the following change in your medication:  1. Diltiazem 30 mg as needed for palpitations.  You may take this up to 4 times daily.  -- If you need a refill on your cardiac medications before your next appointment, please call your pharmacy. --  Labwork: None ordered  Testing/Procedures: None ordered  Follow-Up: Your physician wants you to follow-up in: 1 year with Dr. Curt Bears.  You will receive a reminder letter in the mail two months in advance. If you don't receive a letter, please call our office to schedule the follow-up appointment.  Thank you for choosing [facility]!!   Trinidad Curet, RN [phone]

## 2018-02-10 DIAGNOSIS — Z800 Family history of malignant neoplasm of digestive organs: Secondary | ICD-10-CM | POA: Insufficient documentation

## 2018-02-10 DIAGNOSIS — Z86010 Personal history of colonic polyps: Secondary | ICD-10-CM | POA: Insufficient documentation

## 2018-09-24 DIAGNOSIS — Z85820 Personal history of malignant melanoma of skin: Secondary | ICD-10-CM | POA: Insufficient documentation

## 2018-10-03 ENCOUNTER — Other Ambulatory Visit: Payer: Self-pay | Admitting: Internal Medicine

## 2018-10-09 ENCOUNTER — Ambulatory Visit
Admission: RE | Admit: 2018-10-09 | Discharge: 2018-10-09 | Disposition: A | Discharge status: Home / Self Care | Payer: BLUE CROSS/BLUE SHIELD | Source: Ambulatory Visit | Attending: Internal Medicine | Admitting: Internal Medicine

## 2018-10-16 ENCOUNTER — Ambulatory Visit
Admission: RE | Admit: 2018-10-16 | Discharge: 2018-10-16 | Disposition: A | Discharge status: Home / Self Care | Payer: BLUE CROSS/BLUE SHIELD | Source: Ambulatory Visit | Attending: Internal Medicine | Admitting: Internal Medicine

## 2019-04-14 DIAGNOSIS — E291 Testicular hypofunction: Secondary | ICD-10-CM | POA: Diagnosis not present

## 2019-04-21 DIAGNOSIS — N400 Enlarged prostate without lower urinary tract symptoms: Secondary | ICD-10-CM | POA: Diagnosis not present

## 2019-04-21 DIAGNOSIS — Z87442 Personal history of urinary calculi: Secondary | ICD-10-CM | POA: Diagnosis not present

## 2019-04-21 DIAGNOSIS — E291 Testicular hypofunction: Secondary | ICD-10-CM | POA: Diagnosis not present

## 2019-05-06 DIAGNOSIS — H5213 Myopia, bilateral: Secondary | ICD-10-CM | POA: Diagnosis not present

## 2019-05-06 DIAGNOSIS — H25013 Cortical age-related cataract, bilateral: Secondary | ICD-10-CM | POA: Diagnosis not present

## 2019-05-06 DIAGNOSIS — H2513 Age-related nuclear cataract, bilateral: Secondary | ICD-10-CM | POA: Diagnosis not present

## 2019-05-06 DIAGNOSIS — H524 Presbyopia: Secondary | ICD-10-CM | POA: Diagnosis not present

## 2019-05-13 DIAGNOSIS — Z85820 Personal history of malignant melanoma of skin: Secondary | ICD-10-CM | POA: Diagnosis not present

## 2019-05-13 DIAGNOSIS — L812 Freckles: Secondary | ICD-10-CM | POA: Diagnosis not present

## 2019-05-13 DIAGNOSIS — Z85828 Personal history of other malignant neoplasm of skin: Secondary | ICD-10-CM | POA: Diagnosis not present

## 2019-05-13 DIAGNOSIS — L821 Other seborrheic keratosis: Secondary | ICD-10-CM | POA: Diagnosis not present

## 2019-05-13 DIAGNOSIS — D3617 Benign neoplasm of peripheral nerves and autonomic nervous system of trunk, unspecified: Secondary | ICD-10-CM | POA: Diagnosis not present

## 2019-05-13 DIAGNOSIS — D235 Other benign neoplasm of skin of trunk: Secondary | ICD-10-CM | POA: Diagnosis not present

## 2019-06-11 DIAGNOSIS — I700 Atherosclerosis of aorta: Secondary | ICD-10-CM | POA: Diagnosis not present

## 2019-06-11 DIAGNOSIS — J984 Other disorders of lung: Secondary | ICD-10-CM | POA: Diagnosis not present

## 2019-06-11 DIAGNOSIS — R772 Abnormality of alphafetoprotein: Secondary | ICD-10-CM | POA: Diagnosis not present

## 2019-06-11 DIAGNOSIS — R7989 Other specified abnormal findings of blood chemistry: Secondary | ICD-10-CM | POA: Diagnosis not present

## 2019-06-11 DIAGNOSIS — N281 Cyst of kidney, acquired: Secondary | ICD-10-CM | POA: Diagnosis not present

## 2019-06-23 DIAGNOSIS — D3131 Benign neoplasm of right choroid: Secondary | ICD-10-CM | POA: Diagnosis not present

## 2019-07-22 DIAGNOSIS — L239 Allergic contact dermatitis, unspecified cause: Secondary | ICD-10-CM | POA: Diagnosis not present

## 2019-08-23 ENCOUNTER — Ambulatory Visit (HOSPITAL_COMMUNITY)
Admission: EM | Admit: 2019-08-23 | Discharge: 2019-08-23 | Disposition: A | Discharge status: Home / Self Care | Payer: BC Managed Care – PPO | Attending: Emergency Medicine | Admitting: Emergency Medicine

## 2019-08-23 ENCOUNTER — Encounter (HOSPITAL_COMMUNITY): Payer: Self-pay

## 2019-08-23 ENCOUNTER — Other Ambulatory Visit: Payer: Self-pay

## 2019-08-23 DIAGNOSIS — S61211A Laceration without foreign body of left index finger without damage to nail, initial encounter: Secondary | ICD-10-CM | POA: Diagnosis not present

## 2019-08-23 MED ORDER — TETANUS-DIPHTH-ACELL PERTUSSIS 5-2.5-18.5 LF-MCG/0.5 IM SUSP
0.5000 mL | Freq: Once | INTRAMUSCULAR | Status: AC
  Administered 2019-08-23: 0.5 mL via INTRAMUSCULAR

## 2019-08-23 MED ORDER — TETANUS-DIPHTH-ACELL PERTUSSIS 5-2.5-18.5 LF-MCG/0.5 IM SUSP
INTRAMUSCULAR | Status: AC
  Filled 2019-08-23: qty 0.5

## 2019-08-23 NOTE — ED Triage Notes (Signed)
Pt present laceration to his left hand/finger index with a box cutter. Pt states that this is a brand new blade that  Cut him. He is not up to date on tetanus

## 2019-08-23 NOTE — Discharge Instructions (Addendum)
Tetanus updated today Keep clean and dry Monitor for signs of infection Follow up if not healing

## 2019-08-24 NOTE — ED Provider Notes (Signed)
Carl Dunn    CSN: [id number] Arrival date & time: 08/23/19  1610      History   Chief Complaint Chief Complaint  Patient presents with  . Laceration    left index finger    HPI Carl Dunn is a 68 y.o. male history of hyperlipidemia presenting today for evaluation of laceration to finger.  Patient was opening a box with a new box cutter and accidentally cut his left index finger.  Accident happened earlier today.  He denies difficulty bending his finger or any significant pain.  He is mainly here for his tetanus to be updated.  He believes it has been there for 20 years.  Denies any anticoagulant use.   HPI  Past Medical History:  Diagnosis Date  . Anaphylaxis   . Hyperlipidemia     There are no active problems to display for this patient.   Past Surgical History:  Procedure Laterality Date  . THYROID SURGERY         Home Medications    Prior to Admission medications   Medication Sig Start Date End Date Taking? Authorizing Provider  Ascorbic Acid (VITAMIN C PO) Take 1 tablet by mouth daily.    [provider]  atorvastatin (LIPITOR) 20 MG tablet Take 20 mg by mouth every evening.    [provider]  B Complex Vitamins (VITAMIN B COMPLEX PO) Take 1 tablet by mouth daily.    [provider]  Cholecalciferol (VITAMIN D PO) Take 1 tablet by mouth daily.    [provider]  co-enzyme Q-10 30 MG capsule Take 30 mg by mouth daily.    [provider]  Cyanocobalamin (VITAMIN B-12 PO) Take 1 tablet by mouth daily.    [provider]  diltiazem (CARDIZEM) 30 MG tablet Take 1 tablet (30 mg total) by mouth 4 (four) times daily as needed. 07/15/17   Camnitz, Ocie Doyne, MD  EPINEPHrine (EPIPEN) 0.3 mg/0.3 mL DEVI Inject 0.3 mLs (0.3 mg total) into the muscle as needed (anaphylaxis). 02/12/13   Madaline Brilliant, MD  MAGNESIUM PO Take 1 tablet by mouth daily.    [provider]  Multiple Vitamins-Minerals  (ZINC PO) Take 1 tablet by mouth daily.    [provider]  Omega-3 Fatty Acids (OMEGA 3 PO) Take 1 capsule by mouth daily.    [provider]    Family History Family History  Problem Relation Age of Onset  . Hypoparathyroidism Mother        patient  . Hyperlipidemia Father   . Colon cancer Father   . Heart attack Paternal Grandfather     Social History Social History   Tobacco Use  . Smoking status: Never Smoker  . Smokeless tobacco: Never Used  Substance Use Topics  . Alcohol use: No  . Drug use: No     Allergies   Patient has no known allergies.   Review of Systems Review of Systems  Constitutional: Negative for fatigue and fever.  Eyes: Negative for redness, itching and visual disturbance.  Respiratory: Negative for shortness of breath.   Cardiovascular: Negative for chest pain and leg swelling.  Gastrointestinal: Negative for nausea and vomiting.  Musculoskeletal: Negative for arthralgias and myalgias.  Skin: Positive for wound. Negative for color change and rash.  Neurological: Negative for dizziness, syncope, weakness, light-headedness and headaches.     Physical Exam Triage Vital Signs ED Triage Vitals  Enc Vitals Group     BP 08/23/19 1625 113/66  Pulse Rate 08/23/19 1625 94     Resp 08/23/19 1625 18     Temp 08/23/19 1625 98.3 F (36.8 C)     Temp Source 08/23/19 1625 Oral     SpO2 08/23/19 1625 99 %     Weight --      Height --      Head Circumference --      Peak Flow --      Pain Score 08/23/19 1627 0     Pain Loc --      Pain Edu? --      Excl. in [place]? --    No data found.  Updated Vital Signs BP 113/66 (BP Location: Right Arm)   Pulse 94   Temp 98.3 F (36.8 C) (Oral)   Resp 18   SpO2 99%   Visual Acuity Right Eye Distance:   Left Eye Distance:   Bilateral Distance:    Right Eye Near:   Left Eye Near:    Bilateral Near:     Physical Exam Vitals signs and nursing note reviewed.  Constitutional:       Appearance: He is well-developed.     Comments: No acute distress  HENT:     Head: Normocephalic and atraumatic.     Nose: Nose normal.  Eyes:     Conjunctiva/sclera: Conjunctivae normal.  Neck:     Musculoskeletal: Neck supple.  Cardiovascular:     Rate and Rhythm: Normal rate.  Pulmonary:     Effort: Pulmonary effort is normal. No respiratory distress.  Abdominal:     General: There is no distension.  Musculoskeletal: Normal range of motion.     Comments: Left index finger: Full active range of motion at DIP PIP and metacarpal/phalangeal joint  Skin:    General: Skin is warm and dry.     Comments: Left index finger with 1.25 linear laceration to dorsal surface in between DIP and PIP.  Not actively bleeding, wound edges well realigned without manipulation  Neurological:     Mental Status: He is alert and oriented to person, place, and time.      UC Treatments / Results  Labs (all labs ordered are listed, but only abnormal results are displayed) Labs Reviewed - No data to display  EKG   Radiology No results found.  Procedures Laceration Repair  Date/Time: 08/24/2019 10:51 AM Performed by: Izaiha Lo, [place] C, PA-C Authorized by: Meyer Arora, Elesa Hacker, PA-C   Consent:    Consent obtained:  Verbal   Consent given by:  Patient   Risks discussed:  Pain and poor cosmetic result   Alternatives discussed:  No treatment Anesthesia (see MAR for exact dosages):    Anesthesia method:  None Laceration details:    Location:  Finger   Finger location:  L index finger   Length (cm):  1.3   Depth (mm):  2 Pre-procedure details:    Preparation:  Patient was prepped and draped in usual sterile fashion Exploration:    Hemostasis achieved with:  Direct pressure   Wound extent: no foreign bodies/material noted, no nerve damage noted, no tendon damage noted and no underlying fracture noted     Contaminated: no   Treatment:    Area cleansed with:  Soap and water and Shur-Clens    Amount of cleaning:  Standard   Irrigation solution:  Sterile water   Irrigation volume:  250   Irrigation method:  Syringe   Visualized foreign bodies/material removed: no   Skin repair:  Repair method:  Tissue adhesive Approximation:    Approximation:  Close Post-procedure details:    Dressing:  Non-adherent dressing and splint for protection   Patient tolerance of procedure:  Tolerated well, no immediate complications   (including critical care time)  Medications Ordered in UC Medications  Tdap (BOOSTRIX) injection 0.5 mL (0.5 mLs Intramuscular Given 08/23/19 1700)  Tdap (BOOSTRIX) 5-2.5-18.5 LF-MCG/0.5 injection (has no administration in time range)    Initial Impression / Assessment and Plan / UC Course  I have reviewed the triage vital signs and the nursing notes.  Pertinent labs & imaging results that were available during my care of the patient were reviewed by me and considered in my medical decision making (see chart for details).     Tetanus updated today, wound cleaned and irrigated, closed with Dermabond for protection.  Monitor for signs of infection.  Do not suspect any underlying bony or tendon damage at this time, full active range of motion.Discussed strict return precautions. Patient verbalized understanding and is agreeable with plan.  Final Clinical Impressions(s) / UC Diagnoses   Final diagnoses:  Laceration of left index finger without foreign body, nail damage status unspecified, initial encounter     Discharge Instructions     Tetanus updated today Keep clean and dry Monitor for signs of infection Follow up if not healing   ED Prescriptions    None     PDMP not reviewed this encounter.   Janith Lima, PA-C 08/24/19 1053

## 2019-09-21 DIAGNOSIS — E291 Testicular hypofunction: Secondary | ICD-10-CM | POA: Diagnosis not present

## 2019-09-21 DIAGNOSIS — R972 Elevated prostate specific antigen [PSA]: Secondary | ICD-10-CM | POA: Diagnosis not present

## 2019-09-22 DIAGNOSIS — E213 Hyperparathyroidism, unspecified: Secondary | ICD-10-CM | POA: Diagnosis not present

## 2019-09-22 DIAGNOSIS — Z0000 Encounter for general adult medical examination without abnormal findings: Secondary | ICD-10-CM | POA: Diagnosis not present

## 2019-09-22 DIAGNOSIS — D510 Vitamin B12 deficiency anemia due to intrinsic factor deficiency: Secondary | ICD-10-CM | POA: Diagnosis not present

## 2019-09-22 DIAGNOSIS — E785 Hyperlipidemia, unspecified: Secondary | ICD-10-CM | POA: Diagnosis not present

## 2019-09-22 DIAGNOSIS — E041 Nontoxic single thyroid nodule: Secondary | ICD-10-CM | POA: Diagnosis not present

## 2019-09-28 DIAGNOSIS — E291 Testicular hypofunction: Secondary | ICD-10-CM | POA: Diagnosis not present

## 2019-09-28 DIAGNOSIS — Z87442 Personal history of urinary calculi: Secondary | ICD-10-CM | POA: Diagnosis not present

## 2019-09-28 DIAGNOSIS — R972 Elevated prostate specific antigen [PSA]: Secondary | ICD-10-CM | POA: Diagnosis not present

## 2019-10-08 DIAGNOSIS — Z85828 Personal history of other malignant neoplasm of skin: Secondary | ICD-10-CM | POA: Diagnosis not present

## 2019-10-08 DIAGNOSIS — D1801 Hemangioma of skin and subcutaneous tissue: Secondary | ICD-10-CM | POA: Diagnosis not present

## 2019-10-08 DIAGNOSIS — Z85820 Personal history of malignant melanoma of skin: Secondary | ICD-10-CM | POA: Diagnosis not present

## 2019-10-08 DIAGNOSIS — L821 Other seborrheic keratosis: Secondary | ICD-10-CM | POA: Diagnosis not present

## 2020-01-01 DIAGNOSIS — N400 Enlarged prostate without lower urinary tract symptoms: Secondary | ICD-10-CM | POA: Diagnosis not present

## 2020-01-26 DIAGNOSIS — D225 Melanocytic nevi of trunk: Secondary | ICD-10-CM | POA: Diagnosis not present

## 2020-01-26 DIAGNOSIS — Z85820 Personal history of malignant melanoma of skin: Secondary | ICD-10-CM | POA: Diagnosis not present

## 2020-01-26 DIAGNOSIS — L821 Other seborrheic keratosis: Secondary | ICD-10-CM | POA: Diagnosis not present

## 2020-01-26 DIAGNOSIS — Z85828 Personal history of other malignant neoplasm of skin: Secondary | ICD-10-CM | POA: Diagnosis not present

## 2020-02-15 DIAGNOSIS — L570 Actinic keratosis: Secondary | ICD-10-CM | POA: Diagnosis not present

## 2020-02-15 DIAGNOSIS — L738 Other specified follicular disorders: Secondary | ICD-10-CM | POA: Diagnosis not present

## 2020-02-15 DIAGNOSIS — D2339 Other benign neoplasm of skin of other parts of face: Secondary | ICD-10-CM | POA: Diagnosis not present

## 2020-04-21 IMAGING — US US ABDOMEN COMPLETE W/ ELASTOGRAPHY
1 series · 13 of 25 positions shown · non-contrast
Comparison: None.

CLINICAL DATA: Hemochromatosis.



[Series 1: us abdomen complete w/ elastography · 0.19mm/px · 13 of 70 slices shown]
[im 1/70]
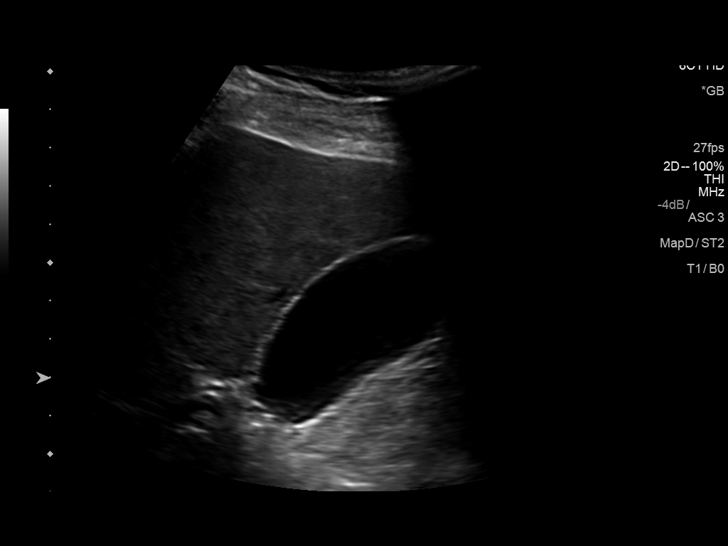
[im 6/70]
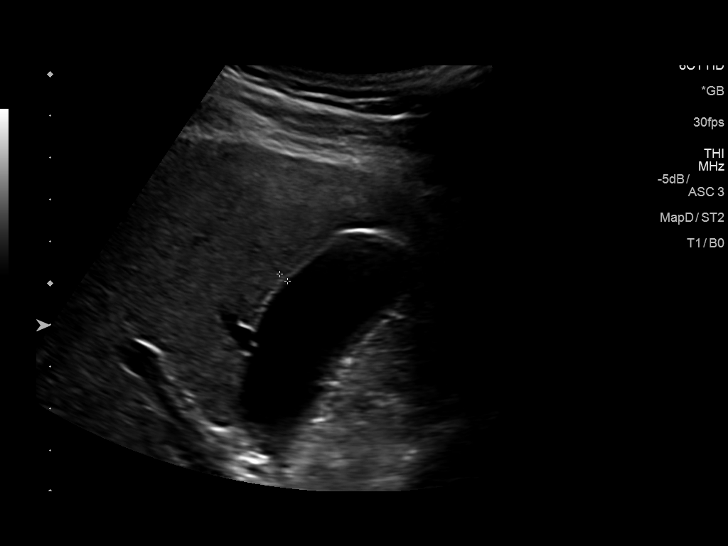
[im 12/70]
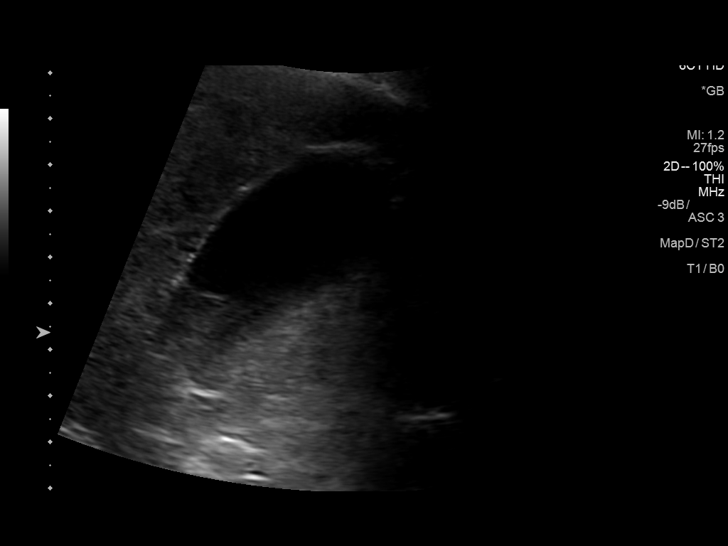
[im 18/70]
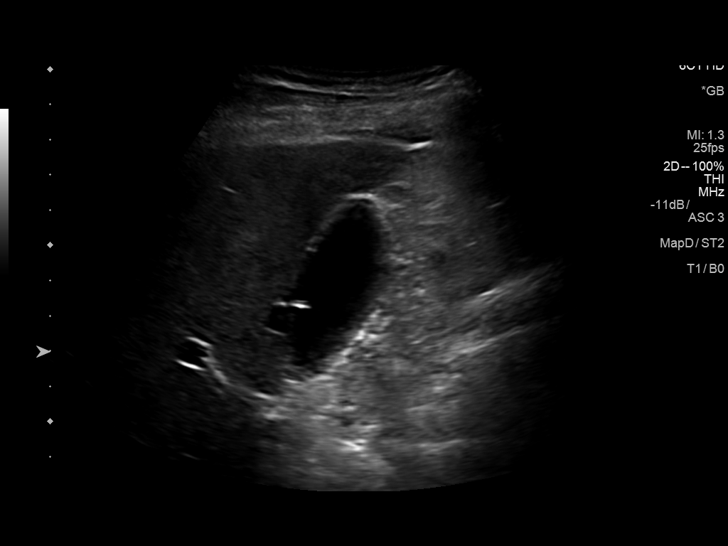
[im 24/70]
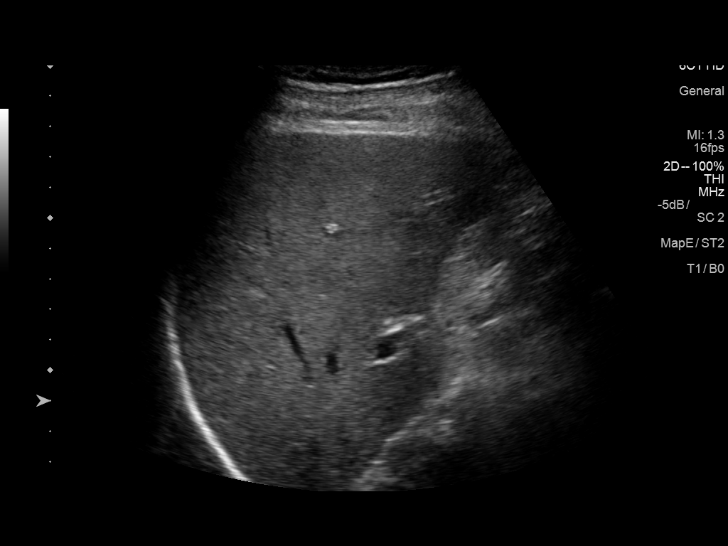
[im 29/70]
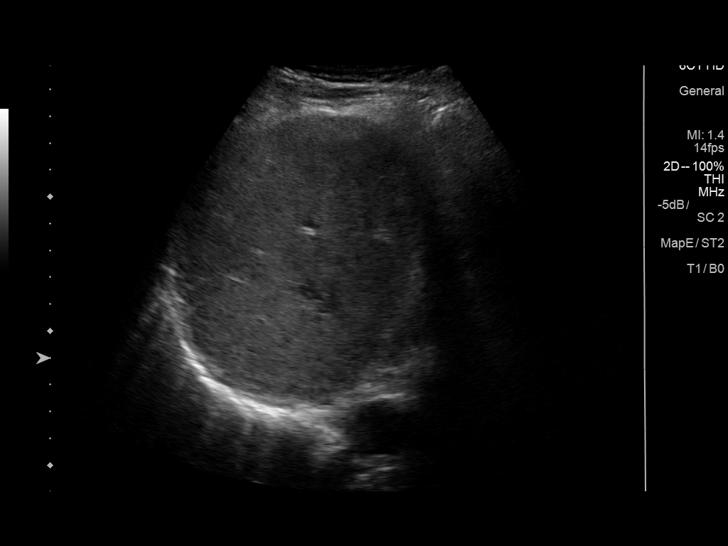
[im 35/70]
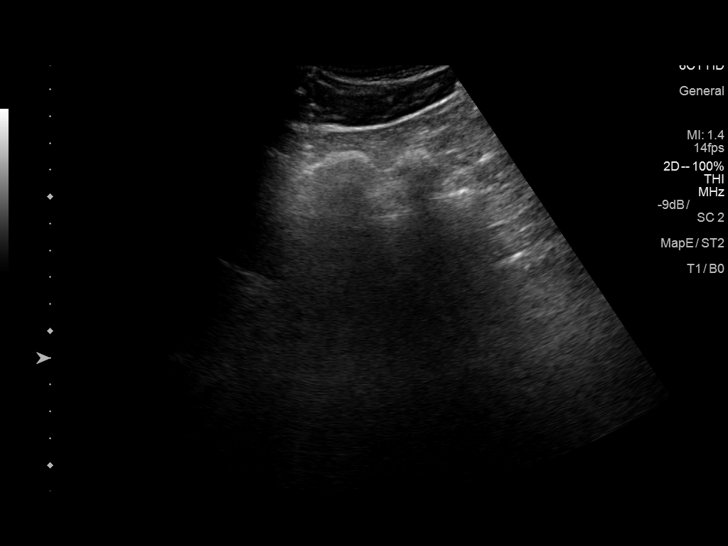
[im 41/70]
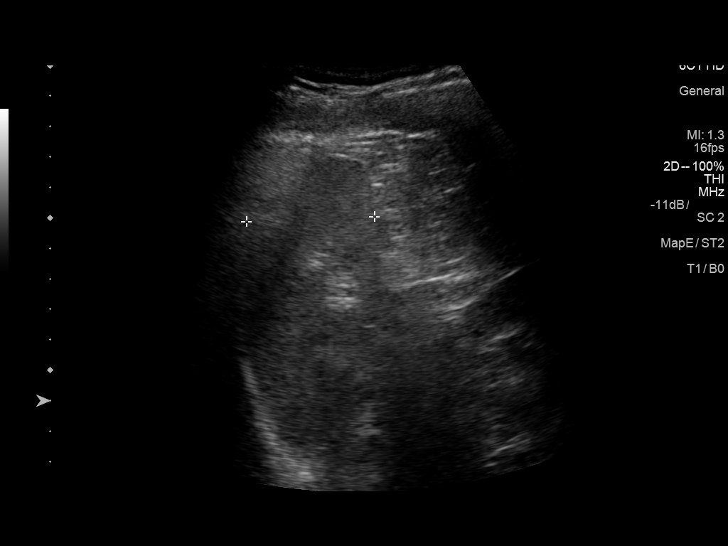
[im 47/70]
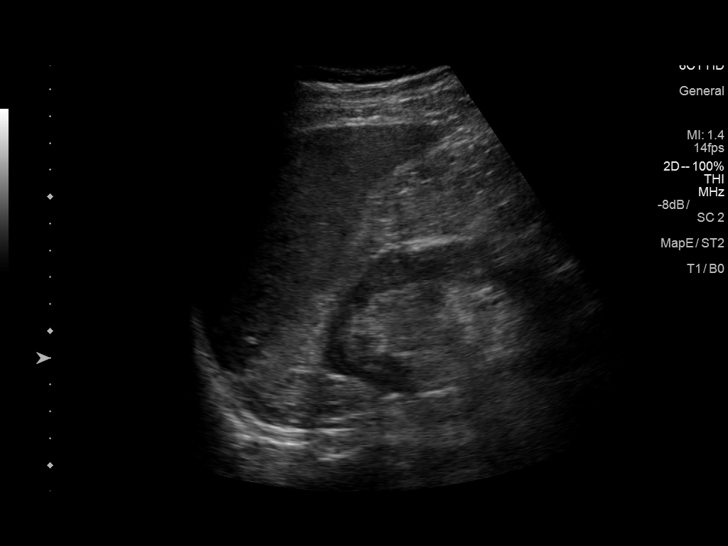
[im 52/70]
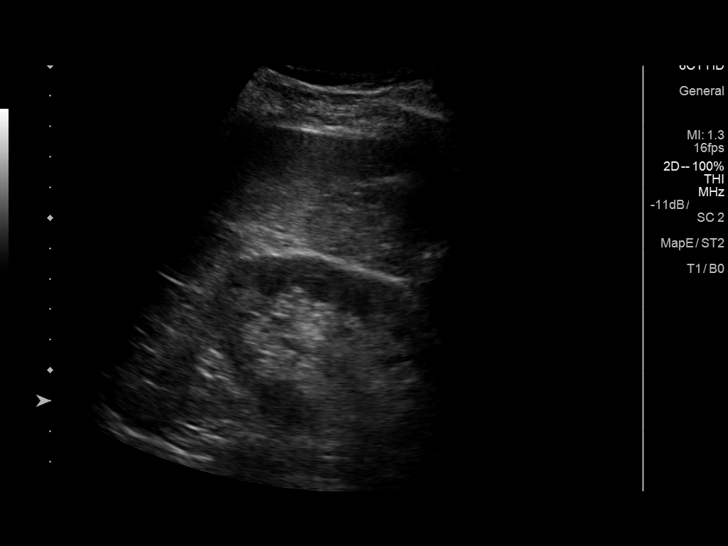
[im 58/70]
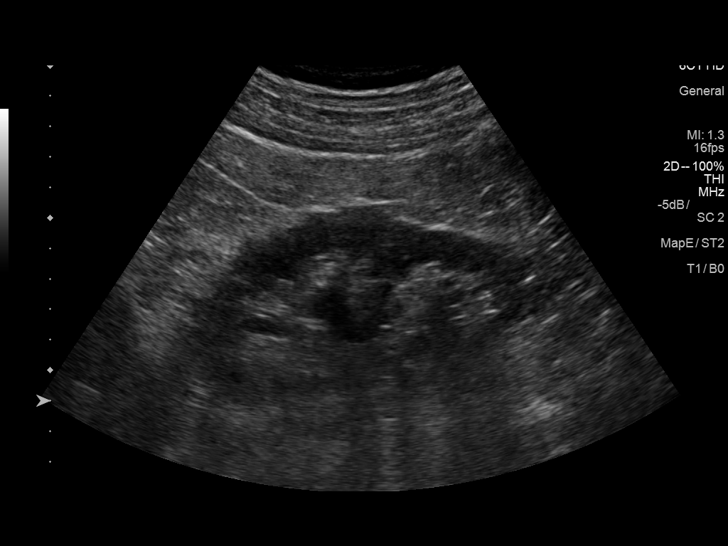
[im 64/70]
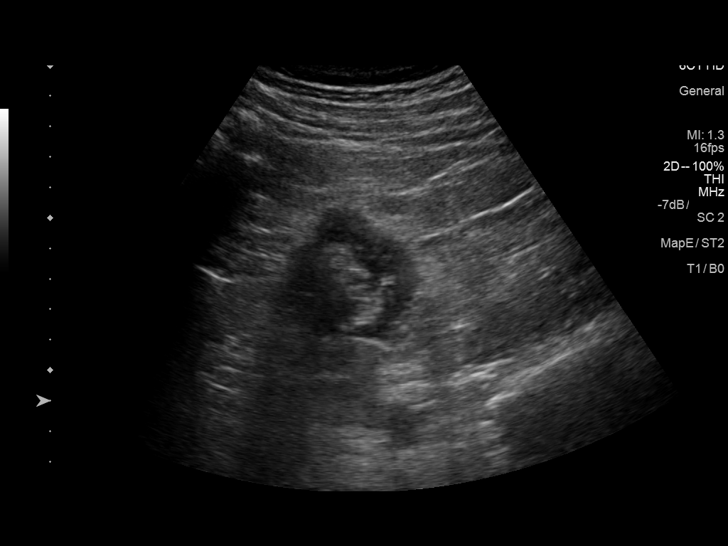
[im 70/70]
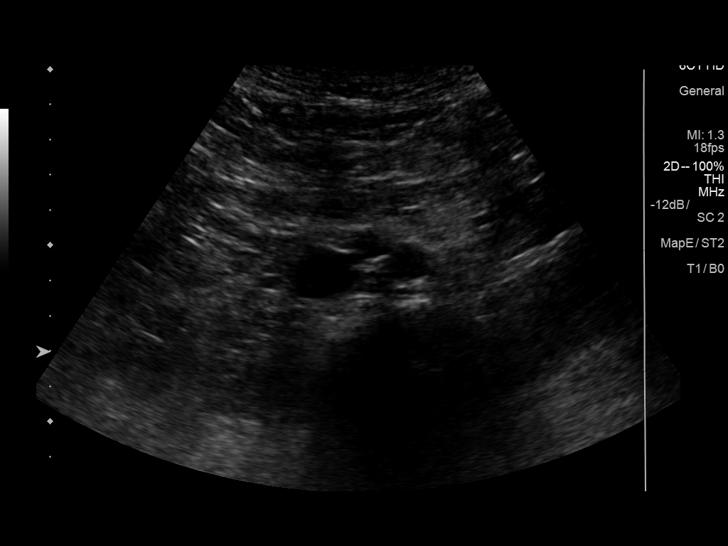

[13 of 25 positions shown; findings below may reference images not displayed]

FINDINGS: ULTRASOUND ABDOMEN

Gallbladder: A 4 mm non-mobile polyp is noted. No gallstones
identified. No evidence of gallbladder wall thickening or
dilatation. No sonographic Murphy sign noted by sonographer.

Common bile duct: Diameter: 3 mm, within normal limits.

Liver: No focal lesion identified. Within normal limits in
parenchymal echogenicity. Portal vein is patent on color Doppler
imaging with normal direction of blood flow towards the liver.

IVC: Obscured by overlying bowel gas.

Pancreas: Obscured by overlying bowel gas.

Spleen: Size and appearance within normal limits.

Right Kidney: Length: 12.1 cm. Echogenicity within normal limits. No
mass or hydronephrosis visualized.

Left Kidney: Length: 13.4 cm. Echogenicity within normal limits. No
mass or hydronephrosis visualized.

Abdominal aorta: No aneurysm visualized.

Other findings: None.

ULTRASOUND HEPATIC ELASTOGRAPHY

Device: Siemens Helix VTQ

Patient position: Oblique

Transducer [id number]

Number of measurements: 10

Hepatic segment:  8

Median velocity:   0.99 m/sec

IQR:

IQR/Median velocity ratio:

Corresponding Metavir fibrosis score:  F0/F1

Risk of fibrosis: Minimal

Limitations of exam: None

Please note that abnormal shear wave velocities may also be
identified in clinical settings other than with hepatic fibrosis,
such as: acute hepatitis, elevated right heart and central venous
pressures including use of beta blockers, Laquanda disease
([place]), infiltrative processes such as
mastocytosis/amyloidosis/infiltrative tumor, extrahepatic
cholestasis, in the post-prandial state, and liver transplantation.
Correlation with patient history, laboratory data, and clinical
condition recommended.
IMPRESSION: ULTRASOUND ABDOMEN:

Tiny gallbladder polyp noted. No evidence of cholelithiasis or
biliary ductal dilatation.

Otherwise unremarkable exam. Unremarkable sonographic appearance of
liver.

ULTRASOUND HEPATIC ELASTOGRAPHY:

Median hepatic shear wave velocity is calculated at 0.99 m/sec.

Corresponding Metavir fibrosis score is F0/F1.

Risk of fibrosis is Minimal.

Follow-up: None required

## 2020-05-09 DIAGNOSIS — N400 Enlarged prostate without lower urinary tract symptoms: Secondary | ICD-10-CM | POA: Diagnosis not present

## 2020-06-21 DIAGNOSIS — E042 Nontoxic multinodular goiter: Secondary | ICD-10-CM | POA: Diagnosis not present

## 2020-07-27 DIAGNOSIS — D225 Melanocytic nevi of trunk: Secondary | ICD-10-CM | POA: Diagnosis not present

## 2020-07-27 DIAGNOSIS — Z85820 Personal history of malignant melanoma of skin: Secondary | ICD-10-CM | POA: Diagnosis not present

## 2020-07-27 DIAGNOSIS — Z85828 Personal history of other malignant neoplasm of skin: Secondary | ICD-10-CM | POA: Diagnosis not present

## 2020-07-27 DIAGNOSIS — L821 Other seborrheic keratosis: Secondary | ICD-10-CM | POA: Diagnosis not present

## 2020-10-12 DIAGNOSIS — E119 Type 2 diabetes mellitus without complications: Secondary | ICD-10-CM | POA: Diagnosis not present

## 2020-10-12 DIAGNOSIS — E213 Hyperparathyroidism, unspecified: Secondary | ICD-10-CM | POA: Diagnosis not present

## 2020-10-12 DIAGNOSIS — Z13220 Encounter for screening for lipoid disorders: Secondary | ICD-10-CM | POA: Diagnosis not present

## 2020-10-12 DIAGNOSIS — E785 Hyperlipidemia, unspecified: Secondary | ICD-10-CM | POA: Diagnosis not present

## 2020-10-12 DIAGNOSIS — D649 Anemia, unspecified: Secondary | ICD-10-CM | POA: Diagnosis not present

## 2020-12-06 DIAGNOSIS — K824 Cholesterolosis of gallbladder: Secondary | ICD-10-CM | POA: Diagnosis not present

## 2020-12-06 DIAGNOSIS — E042 Nontoxic multinodular goiter: Secondary | ICD-10-CM | POA: Diagnosis not present

## 2020-12-26 DIAGNOSIS — E291 Testicular hypofunction: Secondary | ICD-10-CM | POA: Diagnosis not present

## 2020-12-26 DIAGNOSIS — E213 Hyperparathyroidism, unspecified: Secondary | ICD-10-CM | POA: Diagnosis not present

## 2020-12-26 DIAGNOSIS — Z85820 Personal history of malignant melanoma of skin: Secondary | ICD-10-CM | POA: Diagnosis not present

## 2020-12-26 DIAGNOSIS — R39198 Other difficulties with micturition: Secondary | ICD-10-CM | POA: Diagnosis not present

## 2020-12-29 ENCOUNTER — Encounter: Payer: Self-pay | Admitting: Internal Medicine

## 2020-12-29 ENCOUNTER — Ambulatory Visit (INDEPENDENT_AMBULATORY_CARE_PROVIDER_SITE_OTHER): Payer: BC Managed Care – PPO | Admitting: Internal Medicine

## 2020-12-29 ENCOUNTER — Other Ambulatory Visit: Payer: Self-pay

## 2020-12-29 VITALS — BP 120/82 | HR 78 | Ht 70.0 in | Wt 175.4 lb

## 2020-12-29 DIAGNOSIS — E042 Nontoxic multinodular goiter: Secondary | ICD-10-CM

## 2020-12-29 DIAGNOSIS — Z8639 Personal history of other endocrine, nutritional and metabolic disease: Secondary | ICD-10-CM | POA: Diagnosis not present

## 2020-12-29 DIAGNOSIS — R7989 Other specified abnormal findings of blood chemistry: Secondary | ICD-10-CM | POA: Diagnosis not present

## 2020-12-29 DIAGNOSIS — Z9889 Other specified postprocedural states: Secondary | ICD-10-CM

## 2020-12-29 NOTE — Progress Notes (Addendum)
Patient ID: Carl Dunn, male   DOB: 1951/08/11, 70 y.o.   MRN: [id number]   This visit occurred during the SARS-CoV-2 public health emergency.  Safety protocols were in place, including screening questions prior to the visit, additional usage of staff PPE, and extensive cleaning of exam room while observing appropriate contact time as indicated for disinfecting solutions.   HPI  [name] is a 70 y.o.-year-old male, referred by [name], PA-C, for evaluation and management of thyroid nodules, h/o left hemithyroidectomy, and h/o primary hyperparathyroidism.  He is the husband of Carl Dunn, who is also my patient.    He was found to have thyroid nodules in ~2014 during investigation for primary hyperparathyroidism in the setting of kidney stones.  The nodules appeared to have been stable over time.  He had serial ultrasounds with previous endocrinologist.  On the latest ultrasound from 02/2018, no thyroid nodules were seen  As mentioned above, he also has a history of hyperparathyroidism and had parathyroidectomy in 2014 (2 glands removed - [name]).  At that time, a left nodule was resected (left subtotal thyroidectomy).  Subsequently, he had 2 benign biopsies of his dominant right thyroid nodules.  Thyroid U/S (02/24/2018, [name]) - [name]: Small thyroid without nodules, but with some heterogeneous areas.   Thyroid U/S (02/12/2017): Overall the gland is heterogeneous in echotexture consistent with nontoxic  multinodular goiter.  The isthmus measures: 0.47 cm, previously 0.57 cm, previously 0.40 cm.  The right lobe measures: 1.63 x 1.96 x 4.01 cm, previously 1.85 x 2.01 x  3.27 cm, previously 1.64 x 2.2 x 4.94 cm.  The left lobe measures: 1.75 x 1.43 x 3.20 cm, previously 1.14 x 1.26 x  2.46 cm, previously 1.3 x 1.28 x 3.13 cm.  Partial left hemithyroidectomy performed at parathyroid surgery for left sided nodule.    Nodules:  Right Nodule 1.  There is a spongiform  appearing nodule in the superior  posterior lobe that measures 1.07 x 0.92 x 1.26 cm, perviously1.42 x  1.57 x 1.60 cm, previously 1.28 x 1.31 x 1.61 cm, previously 1.35 x 1.46 x  1.53 cm. This nodule is stable and has previously been biopsied and  benign. There is peripheral and minimal internal flow by Doppler. Stable.    Right Nodule 3.  Inferior and medial there is a spongiform appearing  nodules that measures 1.33 x 1.65 x 1.67 cm, previously 1.41 x 1.25 x 2.09  cm, previously 1.4 x 2.06 x 1.69 cm, previously 1.81 x 2.05 x 1.60 cm. This nodule is stable. There is peripheral flow and minimal internal  vascularity.  There is a linear calcification internally in the nodule with posterior acoustic dropout measuring 0.28 cm, previously 0.33 cm, similar to previous at 0.26 cm.  This nodule has previously been sampled and benign.    Left Nodule 1.  Surgically removed at the time of parathyroidectomy. No additional left sided thyroid nodules.    Lymph Node Assessment:  There are no abnormal lymph nodes seen in the left orright neck levels  II-VI including VA/VB    No parathyroid candidates seen, resolved.    Assessment and Plan:  1. Stable multinodular goiter as above.  2. Disposition. Repeat US in 12-24 months as needed   Pt denies: - feeling nodules in neck - hoarseness - dysphagia - choking - SOB with lying down  I reviewed pt's thyroid tests: 12/06/2020: TSH 0.461, normal 02/24/2018: TSH slightly low, at 0.317 No results found for:  TSH, FREET4   He describes that in the past he was hypothyroid and prescribed Synthroid, but later stopped.  Pt mentions: - + fatigue - + cold intolerance - + Intentional weight loss through improve diet - + loss of eyebrows  But denies: - tremors - palpitations - anxiety/depression - hyperdefecation/constipation - dry skin  No FH of thyroid ds. No FH of thyroid cancer. No h/o radiation tx to head or neck. No seaweed but does take  kelp. No recent contrast studies. No steroid use. + Several supplements including DHEA, prostate supplement, zinc, copper.  + Biotin supplements -B complex.  Pt also has a history of primary hyperparathyroidism, for which he had parathyroidectomy in 2014.  PTH decreased from 121 before surgery to 20 after surgery.  Reviewed most recent pertinent labs: 12/06/2020: Calcium 8.2 (8.5-10.5) 08/08/2017: Calcium 9.1, PTH 28 03/06/2016: Vitamin D 38 08/01/2015: Vitamin D 36 No results found for: PTH   Lab Results  Component Value Date   CALCIUM 9.0 10/23/2016  No results found for: VD25OH   He takes vitamin D 5800 units daily.   He also has a history of hereditary hemochromatosis.  He previously had regular phlebotomies. he is AST was high, but per review of previous abdominal imaging, there were no masses in his liver. He had stage 1a melanoma (leg). He also has a history of gout and hyperlipidemia-on Lipitor 20. He is on Clomid 25 mg daily.  Testosterone from 12/26/2020 was normal. He saw Dr. Gaynelle Arabian with urology.  He eats based on Dr. Antionette Char diet.  ROS: Constitutional: + See HPI, + poor sleep Eyes: no blurry vision, no xerophthalmia ENT: no sore throat,  + see HPI Cardiovascular: no CP/SOB/palpitations/leg swelling Respiratory: no cough/SOB Gastrointestinal: no N/V/D/C Musculoskeletal: no muscle/joint aches Skin: no rashes Neurological: no tremors/numbness/tingling/dizziness Psychiatric: no depression/anxiety + Low libido  Past Medical History:  Diagnosis Date  . Anaphylaxis   . Hyperlipidemia    Past Surgical History:  Procedure Laterality Date  . THYROID SURGERY     Social History   Socioeconomic History  . Marital status: Married    Spouse name: Not on file  . Number of children: 0  . Years of education: Not on file  . Highest education level: Not on file  Occupational History  . Occupation: Retired former Research scientist (medical)  . Smoking status: Never  Smoker  . Smokeless tobacco: Never Used  Substance and Sexual Activity  . Alcohol use: No  . Drug use: No  . Sexual activity: Not on file  Other Topics Concern  . Not on file  Social History Narrative  . Not on file   Social Determinants of Health   Financial Resource Strain: Not on file  Food Insecurity: Not on file  Transportation Needs: Not on file  Physical Activity: Not on file  Stress: Not on file  Social Connections: Not on file  Intimate Partner Violence: Not on file   Current Outpatient Medications on File Prior to Visit  Medication Sig Dispense Refill  . Ascorbic Acid (VITAMIN C PO) Take 1 tablet by mouth daily.    Marland Kitchen atorvastatin (LIPITOR) 20 MG tablet Take 20 mg by mouth every evening.    . B Complex Vitamins (VITAMIN B COMPLEX PO) Take 1 tablet by mouth daily.    . Cholecalciferol (VITAMIN D PO) Take 1 tablet by mouth daily.    Marland Kitchen co-enzyme Q-10 30 MG capsule Take 30 mg by mouth daily.    . Cyanocobalamin (VITAMIN  B-12 PO) Take 1 tablet by mouth daily.    Marland Kitchen diltiazem (CARDIZEM) 30 MG tablet Take 1 tablet (30 mg total) by mouth 4 (four) times daily as needed. 15 tablet 1  . Docosahexaenoic Acid (DHA OMEGA 3 PO) Take 2 capsules by mouth every morning.    Marland Kitchen EPINEPHrine (EPIPEN) 0.3 mg/0.3 mL DEVI Inject 0.3 mLs (0.3 mg total) into the muscle as needed (anaphylaxis). 2 Device 1  . Iodine, Kelp, (KELP PO) Take by mouth.    Marland Kitchen MAGNESIUM PO Take 1 tablet by mouth daily.    . Multiple Vitamins-Minerals (ZINC PO) Take 1 tablet by mouth daily.    . Omega-3 Fatty Acids (OMEGA 3 PO) Take 1 capsule by mouth daily.     No current facility-administered medications on file prior to visit.   No Known Allergies Family History  Problem Relation Age of Onset  . Hypoparathyroidism Mother        patient  . Hyperlipidemia Father   . Colon cancer Father   . Heart attack Paternal Grandfather     PE: BP 120/82 (BP Location: Right Arm, Patient Position: Sitting, Cuff Size: Normal)    Pulse 78   Ht 5\' 10"  (1.778 m)   Wt 175 lb 6.4 oz (79.6 kg)   SpO2 97%   BMI 25.17 kg/m  Wt Readings from Last 3 Encounters:  12/29/20 175 lb 6.4 oz (79.6 kg)  07/15/17 198 lb 6.4 oz (90 kg)  10/23/16 203 lb 12.8 oz (92.4 kg)   Constitutional: overweight, in NAD Eyes: PERRLA, EOMI, no exophthalmos ENT: moist mucous membranes, no thyromegaly, no cervical lymphadenopathy Cardiovascular: RRR, No MRG Respiratory: CTA B Gastrointestinal: abdomen soft, NT, ND, BS+ Musculoskeletal: no deformities, strength intact in all 4;  Skin: moist, warm, no rashes Neurological: no tremor with outstretched hands, DTR normal in all 4  ASSESSMENT: 1. H/o Thyroid nodules  2. H/o primary hyperparathyroidism - s/p parathyroidectomy  3.  Low serum calcium  PLAN: 1. H/o Thyroid nodules - I reviewed the report of his most recent thyroid ultrasound along with the patient (images are not available).  The thyroid was small and without any detectable nodules.  He has a history of several thyroid nodules.  He previously had left partial lobectomy at the time of his parathyroidectomy in 2014 - one of the nodules were removed.  2 other nodules in the right thyroid lobe,  had been sampled before with benign results.  These have been stable since 2016 2018, and, as mentioned above, were not clearly detectable on the ultrasound from 2019.  We discussed that his thyroid nodules,  could have been pseudonodules, or areas of inflammation. He would like to keep a close eye on his thyroid we discussed about possibly repeating the thyroid ultrasound at next visit. -Pt does not have a thyroid cancer family history or a personal history of RxTx to head/neck. All these would favor benignity.  -He denies any neck compression symptoms but does complain of fatigue and loss of eyebrows and would like to keep a close eye on his thyroid function.  At today's visit, we will stop his kelp supplement and have him come back for another TSH  (off biotin, also) in 1.5 months. -I will then see him back in 6 months.  2.  History of primary hyperparathyroidism and 3.  Low serum calcium -Previously seen at [facility] -The abnormal parathyroid were not visualized before the surgery so he had exploratory parathyroidectomy -2 glands resected in 2014 -Latest  calcium reviewed from 12/06/2020 was slightly low, at 8.2 (8.5-10.5) -We will check a vitamin D level today.  Of note, he is on 5800 units vitamin D daily -At today's visit, we will recheck his vitamin D level -Also, we will have him come back in 1.5 months for repeat calcium level  Component     Latest Ref Rng & Units 12/29/2020  Vitamin D, 25-Hydroxy     30.0 - 100.0 ng/mL 69.1  Vitamin D level is excellent.  We can continue the same supplementation dose.  Component     Latest Ref Rng & Units 02/10/2021  Calcium Ionized     4.8 - 5.6 mg/dL 4.64 (L)  Triiodothyronine,Free,Serum     2.3 - 4.2 pg/mL 3.0  TSH     0.35 - 4.50 uIU/mL 1.02  T4,Free(Direct)     0.60 - 1.60 ng/dL 0.72  Calcium continues to be slightly low. I will ask him to add a Tums tablet daily.   TFTs are excellent.   Philemon Kingdom, MD PhD [facility] Endocrin[facility]

## 2020-12-29 NOTE — Patient Instructions (Addendum)
  Please stop Kelp.  Hold B complex or anything with Biotin for at least 2-3 days before thyroid labs.  Please come back for labs when 1.5 months.  Please return to see me in 6 months.    Marland Kitchen

## 2020-12-30 ENCOUNTER — Telehealth: Payer: Self-pay

## 2020-12-30 LAB — VITAMIN D 25 HYDROXY (VIT D DEFICIENCY, FRACTURES): Vit D, 25-Hydroxy: 69.1 ng/mL (ref 30.0–100.0)

## 2020-12-30 NOTE — Telephone Encounter (Signed)
-----   Message from Philemon Kingdom, MD sent at 12/30/2020  3:27 PM EST ----- Can you please call pt.: His vitamin D level is excellent, at 69.  Please continue the same dose of vitamin D supplementation.

## 2021-01-31 DIAGNOSIS — L550 Sunburn of first degree: Secondary | ICD-10-CM | POA: Diagnosis not present

## 2021-01-31 DIAGNOSIS — Z85820 Personal history of malignant melanoma of skin: Secondary | ICD-10-CM | POA: Diagnosis not present

## 2021-01-31 DIAGNOSIS — L570 Actinic keratosis: Secondary | ICD-10-CM | POA: Diagnosis not present

## 2021-01-31 DIAGNOSIS — L821 Other seborrheic keratosis: Secondary | ICD-10-CM | POA: Diagnosis not present

## 2021-01-31 DIAGNOSIS — Z85828 Personal history of other malignant neoplasm of skin: Secondary | ICD-10-CM | POA: Diagnosis not present

## 2021-02-10 ENCOUNTER — Other Ambulatory Visit: Payer: Self-pay

## 2021-02-10 ENCOUNTER — Other Ambulatory Visit (INDEPENDENT_AMBULATORY_CARE_PROVIDER_SITE_OTHER): Payer: BC Managed Care – PPO

## 2021-02-10 DIAGNOSIS — R7989 Other specified abnormal findings of blood chemistry: Secondary | ICD-10-CM

## 2021-02-10 DIAGNOSIS — Z9889 Other specified postprocedural states: Secondary | ICD-10-CM

## 2021-02-10 LAB — T4, FREE: Free T4: 0.72 ng/dL (ref 0.60–1.60)

## 2021-02-10 LAB — T3, FREE: T3, Free: 3 pg/mL (ref 2.3–4.2)

## 2021-02-10 LAB — TSH: TSH: 1.02 u[IU]/mL (ref 0.35–4.50)

## 2021-02-12 LAB — CALCIUM, IONIZED: Calcium, Ion: 4.64 mg/dL — ABNORMAL LOW (ref 4.8–5.6)

## 2021-04-18 DIAGNOSIS — H524 Presbyopia: Secondary | ICD-10-CM | POA: Diagnosis not present

## 2021-04-18 DIAGNOSIS — D3131 Benign neoplasm of right choroid: Secondary | ICD-10-CM | POA: Diagnosis not present

## 2021-04-18 DIAGNOSIS — H52203 Unspecified astigmatism, bilateral: Secondary | ICD-10-CM | POA: Diagnosis not present

## 2021-05-02 DIAGNOSIS — S6982XA Other specified injuries of left wrist, hand and finger(s), initial encounter: Secondary | ICD-10-CM | POA: Diagnosis not present

## 2021-05-02 DIAGNOSIS — M79642 Pain in left hand: Secondary | ICD-10-CM | POA: Diagnosis not present

## 2021-05-12 DIAGNOSIS — K5730 Diverticulosis of large intestine without perforation or abscess without bleeding: Secondary | ICD-10-CM | POA: Diagnosis not present

## 2021-05-12 DIAGNOSIS — Z1211 Encounter for screening for malignant neoplasm of colon: Secondary | ICD-10-CM | POA: Diagnosis not present

## 2021-05-12 DIAGNOSIS — Z800 Family history of malignant neoplasm of digestive organs: Secondary | ICD-10-CM | POA: Diagnosis not present

## 2021-05-31 ENCOUNTER — Ambulatory Visit: Payer: BC Managed Care – PPO | Admitting: Physician Assistant

## 2021-06-21 ENCOUNTER — Ambulatory Visit: Payer: BC Managed Care – PPO | Admitting: Nurse Practitioner

## 2021-06-21 ENCOUNTER — Telehealth: Payer: Self-pay | Admitting: Internal Medicine

## 2021-06-21 NOTE — Telephone Encounter (Signed)
Patient left without being seen due to refusing to wear a mask due to a religious exemption.

## 2021-06-27 ENCOUNTER — Other Ambulatory Visit: Payer: Self-pay

## 2021-06-27 ENCOUNTER — Encounter: Payer: Self-pay | Admitting: Internal Medicine

## 2021-06-27 ENCOUNTER — Ambulatory Visit (INDEPENDENT_AMBULATORY_CARE_PROVIDER_SITE_OTHER): Payer: BC Managed Care – PPO | Admitting: Internal Medicine

## 2021-06-27 VITALS — BP 110/88 | HR 85 | Ht 70.0 in | Wt 169.4 lb

## 2021-06-27 DIAGNOSIS — E785 Hyperlipidemia, unspecified: Secondary | ICD-10-CM | POA: Diagnosis not present

## 2021-06-27 DIAGNOSIS — E042 Nontoxic multinodular goiter: Secondary | ICD-10-CM | POA: Diagnosis not present

## 2021-06-27 DIAGNOSIS — R5383 Other fatigue: Secondary | ICD-10-CM | POA: Diagnosis not present

## 2021-06-27 DIAGNOSIS — Z9889 Other specified postprocedural states: Secondary | ICD-10-CM | POA: Diagnosis not present

## 2021-06-27 DIAGNOSIS — R7989 Other specified abnormal findings of blood chemistry: Secondary | ICD-10-CM | POA: Diagnosis not present

## 2021-06-27 DIAGNOSIS — Z8639 Personal history of other endocrine, nutritional and metabolic disease: Secondary | ICD-10-CM | POA: Diagnosis not present

## 2021-06-27 LAB — LIPID PANEL
Cholesterol: 232 mg/dL — ABNORMAL HIGH (ref 0–200)
HDL: 55.9 mg/dL (ref >39.00)
LDL Cholesterol: 158 mg/dL — ABNORMAL HIGH (ref 0–99)
NonHDL: 176.59
Total CHOL/HDL Ratio: 4
Triglycerides: 92 mg/dL (ref 0.0–149.0)
VLDL: 18.4 mg/dL (ref 0.0–40.0)

## 2021-06-27 LAB — T3, FREE: T3, Free: 2.8 pg/mL (ref 2.3–4.2)

## 2021-06-27 LAB — VITAMIN B12: Vitamin B-12: 408 pg/mL (ref 211–911)

## 2021-06-27 LAB — TSH: TSH: 0.75 u[IU]/mL (ref 0.35–5.50)

## 2021-06-27 LAB — T4, FREE: Free T4: 0.76 ng/dL (ref 0.60–1.60)

## 2021-06-27 NOTE — Progress Notes (Signed)
Patient ID: Carl Dunn, male   DOB: [birth year], 70 y.o.   MRN: [id number]   This visit occurred during the SARS-CoV-2 public health emergency.  Safety protocols were in place, including screening questions prior to the visit, additional usage of staff PPE, and extensive cleaning of exam room while observing appropriate contact time as indicated for disinfecting solutions.   HPI  Carl Dunn is a 70 y.o.-year-old male, returning for follow-up for thyroid nodules, h/o left hemithyroidectomy, and h/o primary hyperparathyroidism.  He is the husband of Carl Dunn, who is also my patient.  Last visit 6 months ago.  Interim history: He lost weight since last OV and after our last visit, he started feeling great, w/o mental fog.  He mentions that this happens after he stopped all his supplements after our last office visit (I have advised him to stop only some of them).  However, in the last month or so, he started to have a little bit more mental fog and fatigue.  However, overall, he feels much better compared to last visit.  He has plenty of energy, no pain. He has hereditary hemochromatosis and has therapeutic phlebotomy. He came off Atorvastatin since last OV >> would want me to recheck this. He is still on the [employer] with 16-8h fasting-eating window. He does not have a PCP and would not want to establish care with one.  He was found to have thyroid nodules in ~2014 during investigation for primary hyperparathyroidism in the setting of kidney stones.  The nodules appeared to have been stable over time.  He had serial ultrasounds with previous endocrinologist.  On the latest ultrasound from 02/2018, no thyroid nodules were seen  As mentioned above, he also has a history of hyperparathyroidism and had parathyroidectomy in 2014 (2 glands removed - [name]).  At that time, a left nodule was resected (left subtotal thyroidectomy).  Subsequently, he had 2 benign biopsies of his dominant right  thyroid nodules.  Thyroid U/S (02/24/2018, [facility]) - [name]: Small thyroid without nodules, but with some heterogeneous areas.   Thyroid U/S (02/12/2017): Overall the gland is heterogeneous in echotexture consistent with nontoxic  multinodular goiter.  The isthmus measures: 0.47 cm, previously 0.57 cm, previously 0.40 cm.  The right lobe measures: 1.63 x 1.96 x 4.01 cm, previously 1.85 x 2.01 x  3.27 cm, previously 1.64 x 2.2 x 4.94 cm.  The left lobe measures: 1.75 x 1.43 x 3.20 cm, previously 1.14 x 1.26 x  2.46 cm, previously 1.3 x 1.28 x 3.13 cm.  Partial left hemithyroidectomy performed at parathyroid surgery for left sided nodule.     Nodules:  Right Nodule 1.  There is a spongiform appearing nodule in the superior  posterior lobe that measures 1.07 x 0.92 x 1.26 cm, perviously  1.42 x  1.57 x 1.60 cm, previously 1.28 x 1.31 x 1.61 cm, previously 1.35 x 1.46 x  1.53 cm. This nodule is stable and has previously been biopsied and  benign. There is peripheral and minimal internal flow by Doppler. Stable.     Right Nodule 3.  Inferior and medial there is a spongiform appearing  nodules that measures 1.33 x 1.65 x 1.67 cm, previously 1.41 x 1.25 x 2.09  cm, previously 1.4 x 2.06 x 1.69 cm, previously 1.81 x 2.05 x 1.60 cm. This nodule is stable. There is peripheral flow and minimal internal  vascularity.  There is a linear calcification internally in the nodule with posterior acoustic  dropout measuring 0.28 cm, previously 0.33 cm, similar to previous at 0.26 cm.  This nodule has previously been sampled and benign.     Left Nodule 1.  Surgically removed at the time of parathyroidectomy. No additional left sided thyroid nodules.     Lymph Node Assessment:  There are no abnormal lymph nodes seen in the left or right neck levels  II-VI including VA/VB     No parathyroid candidates seen, resolved.     Assessment and Plan:  1. Stable multinodular goiter as above.  2. Disposition.  Repeat US in 12-24 months as needed   Pt denies: - feeling nodules in neck - hoarseness - dysphagia - choking - SOB with lying down  I reviewed pt's thyroid tests - he has a history of low TSH in 2019, which resolved: Lab Results  Component Value Date   TSH 1.02 02/10/2021   FREET4 0.72 02/10/2021  12/06/2020: TSH 0.461, normal 02/24/2018: TSH slightly low, at 0.317  He describes that in the past he was hypothyroid and prescribed Synthroid, but later stopped.  But denies: - tremors - palpitations - anxiety/depression - hyperdefecation/constipation - dry skin  No FH of thyroid ds. No FH of thyroid cancer. No h/o radiation tx to head or neck.  She was on kelp before last visit, which was stopped. No steroid use. + Several supplements including DHEA, prostate supplement, zinc, copper.  + Biotin supplements -B complex.  Pt also has a history of primary hyperparathyroidism, for which he had parathyroidectomy in 2014.  PTH decreased from 121 before surgery to 20 after surgery.  Reviewed most recent pertinent labs: 06/14/2021: Calcium 9.3 02/10/2021: Ionized calcium 4.64 (4.8-5.6) 12/06/2020: Calcium 8.2 (8.5-10.5) 08/08/2017: Calcium 9.1, PTH 28 03/06/2016: Vitamin D 38 08/01/2015: Vitamin D 36 No results found for: PTH   Lab Results  Component Value Date   CALCIUM 9.0 10/23/2016   Lab Results  Component Value Date   VD25OH 69.1 12/29/2020    He was on vitamin D 5800 units daily >> stopped for few mo.   He also has a history of hereditary hemochromatosis.  He previously had regular phlebotomies. he is AST was high, but per review of previous abdominal imaging, there were no masses in his liver. He had stage 1a melanoma (leg). He also has a history of gout  He is on Clomid 25 mg daily.  Testosterone from 12/26/2020 was normal. He saw Dr. Gaynelle Arabian with urology.  He has a history of hyperlipidemia No results found for: CHOL, HDL, LDLCALC, LDLDIRECT, TRIG,  CHOLHDL  -Previously on on Lipitor 20, now off for few months  ROS: + See HPI  I reviewed pt's medications, allergies, PMH, social hx, family hx, and changes were documented in the history of present illness. Otherwise, unchanged from my initial visit note.  Past Medical History:  Diagnosis Date   Anaphylaxis    Hyperlipidemia    Past Surgical History:  Procedure Laterality Date   THYROID SURGERY     Social History   Socioeconomic History   Marital status: Married    Spouse name: Not on file   Number of children: 0   Years of education: Not on file   Highest education level: Not on file  Occupational History   Occupation: Retired former [employer] copy  Tobacco Use   Smoking status: Never   Smokeless tobacco: Never  Substance and Sexual Activity   Alcohol use: No   Drug use: No   Sexual activity: Not on file  Other Topics Concern   Not on file  Social History Narrative   Not on file   Social Determinants of Health   Financial Resource Strain: Not on file  Food Insecurity: Not on file  Transportation Needs: Not on file  Physical Activity: Not on file  Stress: Not on file  Social Connections: Not on file  Intimate Partner Violence: Not on file   Current Outpatient Medications on File Prior to Visit  Medication Sig Dispense Refill   Ascorbic Acid (VITAMIN C PO) Take 1 tablet by mouth daily.     atorvastatin (LIPITOR) 20 MG tablet Take 20 mg by mouth every evening.     B Complex Vitamins (VITAMIN B COMPLEX PO) Take 1 tablet by mouth daily.     Cholecalciferol (VITAMIN D PO) Take 1 tablet by mouth daily.     co-enzyme Q-10 30 MG capsule Take 30 mg by mouth daily.     Cyanocobalamin (VITAMIN B-12 PO) Take 1 tablet by mouth daily.     diltiazem (CARDIZEM) 30 MG tablet Take 1 tablet (30 mg total) by mouth 4 (four) times daily as needed. 15 tablet 1   Docosahexaenoic Acid (DHA OMEGA 3 PO) Take 2 capsules by mouth every morning.     EPINEPHrine (EPIPEN) 0.3 mg/0.3 mL  DEVI Inject 0.3 mLs (0.3 mg total) into the muscle as needed (anaphylaxis). 2 Device 1   Iodine, Kelp, (KELP PO) Take by mouth.     MAGNESIUM PO Take 1 tablet by mouth daily.     Multiple Vitamins-Minerals (ZINC PO) Take 1 tablet by mouth daily.     Omega-3 Fatty Acids (OMEGA 3 PO) Take 1 capsule by mouth daily.     No current facility-administered medications on file prior to visit.   No Known Allergies Family History  Problem Relation Age of Onset   Hypoparathyroidism Mother        patient   Cancer Mother    Hyperlipidemia Father    Colon cancer Father    Hypertension Father    Cancer Father    Heart attack Paternal Grandfather     PE: BP 110/88 (BP Location: Right Arm, Patient Position: Sitting, Cuff Size: Normal)   Pulse 85   Ht '5\' 10"'$  (1.778 m)   Wt 169 lb 6.4 oz (76.8 kg)   SpO2 98%   BMI 24.31 kg/m  Wt Readings from Last 3 Encounters:  06/27/21 169 lb 6.4 oz (76.8 kg)  12/29/20 175 lb 6.4 oz (79.6 kg)  07/15/17 198 lb 6.4 oz (90 kg)   Constitutional: overweight, in NAD Eyes: PERRLA, EOMI, no exophthalmos ENT: moist mucous membranes, no thyromegaly, no cervical lymphadenopathy Cardiovascular: RRR, No MRG Respiratory: CTA B Gastrointestinal: abdomen soft, NT, ND, BS+ Musculoskeletal: no deformities, strength intact in all 4;  Skin: moist, warm, no rashes Neurological: no tremor with outstretched hands, DTR normal in all 4  ASSESSMENT: 1. H/o Thyroid nodules  2. H/o primary hyperparathyroidism - s/p parathyroidectomy  3.  Low serum calcium  4.  History of a low TSH  5.  Mild fatigue  6.  Hyperlipidemia  PLAN: 1. H/o Thyroid nodules -Patient has a history of several thyroid nodules, but latest thyroid ultrasound showed a small thyroid without any detectable nodules.  He previously had a left partial lobectomy at the time of his parathyroidectomy in 2014, during which one of the nodules were removed.  2 other nodules in the right thyroid lobe have been  sampled in the past with benign results.  These have been stable since 2016 to 2018 and, as mentioned above, however not clearly detectable on the ultrasound from 2019.  We discussed that these could have been due to nodules or areas of inflammation.  He would like to keep a close eye on his thyroid and we discussed about the possibility of repeating the thyroid ultrasound in the future per his request, however, I do not feel that this is high yield. Will defer this for now. -He does not have a thyroid cancer family history (no history of radiation therapy to head or neck.  All these would favor benignity. -Also, no neck compression symptoms -At last visit, he complained of fatigue and loss of eyebrows.  We checked a set of thyroid tests after we stopped kelp and biotin and this was normal.  We will repeat these now per his request.  2.  History of primary hyperparathyroidism and 3.  Low serum calcium -Previously seen at [facility] -The abnormal parathyroid were not visualized before the surgery so he had exploratory parathyroidectomy - 2 glands resected in 2014 -Calcium on 12/06/2020 was slightly low, at 8.2 (8.5-10.5); at last visit, an ionized calcium was also slightly low, at 4.64 (4.8-5.6).  At that time, I explained that for postsurgical hypocalcemia, the calcium target is lower limit of normal or slightly lower than this for long-term management.  We did discuss about adding 1 Tums tablet daily at that time.  Latest calcium was normal, at 9.3 on 06/14/2021. -At last visit we checked a vitamin D level which was 69.1, excellent in 12/2019.  He was on 5800 units vitamin D daily at the time, but he stopped after our last visit. -We will recheck his vitamin D at this visit to see if he needs to restart -I will then see him back in 1 year.  4.  History of a low TSH - in 2019 he had a mildly low TSH,  0.3 - This could be related to kelp or biotin use - After last visit, TFTs were normal of the above  supplements - We will recheck them today  5.  Mild fatigue -We will check vitamin D and vitamin B12 to see if he needs to restart these. -No pain or other complaints except for mild mental fog  6.  Hyperlipidemia -Previously on cholesterol medication and mentions that his total cholesterol was in the 160s while taking the statins -He would like me to check his lipids again today, after coming off the statins after our last visit.  Component     Latest Ref Rng & Units 06/27/2021  Cholesterol     0 - 200 mg/dL 232 (H)  Triglycerides     0.0 - 149.0 mg/dL 92.0  HDL Cholesterol     >39.00 mg/dL 55.90  VLDL     0.0 - 40.0 mg/dL 18.4  LDL (calc)     0 - 99 mg/dL 158 (H)  Total CHOL/HDL Ratio      4  NonHDL      176.59  Vitamin D, 25-Hydroxy     30.0 - 100.0 ng/mL 45.4  Triiodothyronine,Free,Serum     2.3 - 4.2 pg/mL 2.8  TSH     0.35 - 5.50 uIU/mL 0.75  T4,Free(Direct)     0.60 - 1.60 ng/dL 0.76  Vitamin B12     211 - 911 pg/mL 408   LDL elevated.  I would suggest to restart Lipitor 20 mg daily. Rest of the labs are at goal.  Philemon Kingdom, MD PhD [facility]

## 2021-06-27 NOTE — Patient Instructions (Addendum)
Please stop at the lab.  Please come back for a follow-up appointment in 1 year.  

## 2021-06-28 ENCOUNTER — Encounter: Payer: Self-pay | Admitting: Internal Medicine

## 2021-06-28 DIAGNOSIS — N200 Calculus of kidney: Secondary | ICD-10-CM | POA: Insufficient documentation

## 2021-06-28 DIAGNOSIS — E785 Hyperlipidemia, unspecified: Secondary | ICD-10-CM | POA: Insufficient documentation

## 2021-06-28 HISTORY — DX: Calculus of kidney: N20.0

## 2021-06-28 LAB — VITAMIN D 25 HYDROXY (VIT D DEFICIENCY, FRACTURES): Vit D, 25-Hydroxy: 45.4 ng/mL (ref 30.0–100.0)

## 2021-06-29 ENCOUNTER — Other Ambulatory Visit: Payer: Self-pay | Admitting: Internal Medicine

## 2021-06-29 MED ORDER — ATORVASTATIN CALCIUM 20 MG PO TABS: 20.0000 mg | ORAL_TABLET | Freq: Every evening | ORAL | 3 refills | Status: DC

## 2021-06-30 ENCOUNTER — Ambulatory Visit: Payer: BC Managed Care – PPO | Admitting: Internal Medicine

## 2021-07-07 ENCOUNTER — Ambulatory Visit: Payer: BC Managed Care – PPO | Admitting: Internal Medicine

## 2021-08-02 ENCOUNTER — Telehealth: Payer: Self-pay

## 2021-08-02 NOTE — Telephone Encounter (Signed)
[facility] inbound fax requesting medical diagnosis codes has been filled out and signed by provider and faxed over. Fax number: [phone]

## 2021-08-03 ENCOUNTER — Telehealth: Payer: Self-pay | Admitting: Neurology

## 2021-08-03 ENCOUNTER — Ambulatory Visit (INDEPENDENT_AMBULATORY_CARE_PROVIDER_SITE_OTHER): Payer: BC Managed Care – PPO | Admitting: Neurology

## 2021-08-03 ENCOUNTER — Encounter: Payer: Self-pay | Admitting: Neurology

## 2021-08-03 VITALS — BP 101/67 | HR 76 | Ht 70.0 in | Wt 165.0 lb

## 2021-08-03 DIAGNOSIS — R413 Other amnesia: Secondary | ICD-10-CM

## 2021-08-03 DIAGNOSIS — G3184 Mild cognitive impairment, so stated: Secondary | ICD-10-CM | POA: Insufficient documentation

## 2021-08-03 NOTE — Telephone Encounter (Signed)
He will change her insurance company at the end of October, please try to arrange MRI of the brain before the end of October, 2022

## 2021-08-03 NOTE — Progress Notes (Signed)
Chief Complaint  Patient presents with   New Patient (Initial Visit)    New rm, with wife, here for memory loss and brain fog, states short term memory is worse,       ASSESSMENT AND PLAN  Carl Dunn is a 70 y.o. male   Cognitive impairment  Mainly short-term memory loss  Laboratory evaluation showed no treatable etiology  Return to clinic in 3 months   DIAGNOSTIC DATA (LABS, IMAGING, TESTING) - I reviewed patient records, labs, notes, testing and imaging myself where available. Laboratory evaluation in September 2022: Normal B12, thyroid functional test, vitamin D level, mild elevated total cholesterol 232, LDL 158, normal CBC hemoglobin 15.2, CMP, creatinine of 1.17, iron profile, ferritin level was mildly decreased 22  MEDICAL HISTORY:  Carl Dunn, is a 70 year old male, seen in request by his primary care PA [name] for evaluation of memory loss, he is accompanied by his wife at today's visit August 03, 2021    I reviewed and summarized the referring note. PMHx  HLD Hemochromatosis, 2020, improved with frequent phlebotomy Hypercalcemia, due to parathyroid tumor, had surgery in 2014  He retired from [employer], has been very physically mentally active all his life, even to this day, exercise regularly, since 2021, he was noted to have intermittent memory loss, mainly short-term memory loss, he tends to misplace things, brain foggy sensation, this will cause some frustration, he has been very organized meticulous about detail.  Besides being physically active, he continue to be mentally active, doing a lot of self-motivated research project.  PHYSICAL EXAM:   Vitals:   08/03/21 1351  BP: 101/67  Pulse: 76  Weight: 165 lb (74.8 kg)  Height: 5\' 10"  (1.778 m)   Not recorded     Body mass index is 23.68 kg/m.  PHYSICAL EXAMNIATION:  Gen: NAD, conversant, well nourised, well groomed                     Cardiovascular: Regular rate rhythm, no  peripheral edema, warm, nontender. Eyes: Conjunctivae clear without exudates or hemorrhage Neck: Supple, no carotid bruits. Pulmonary: Clear to auscultation bilaterally   NEUROLOGICAL EXAM:  MENTAL STATUS: Speech:    Speech is normal; fluent and spontaneous with normal comprehension.  Cognition:     Montreal Cognitive Assessment  08/03/2021  Visuospatial/ Executive (0/5) 4  Naming (0/3) 3  Attention: Read list of digits (0/2) 2  Attention: Read list of letters (0/1) 1  Attention: Serial 7 subtraction starting at 100 (0/3) 3  Language: Repeat phrase (0/2) 2  Language : Fluency (0/1) 1  Abstraction (0/2) 2  Delayed Recall (0/5) 1  Orientation (0/6) 6  Total 25  Adjusted Score (based on education) 25      CRANIAL NERVES: CN II: Visual fields are full to confrontation. Pupils are round equal and briskly reactive to light. CN III, IV, VI: extraocular movement are normal. No ptosis. CN V: Facial sensation is intact to light touch CN VII: Face is symmetric with normal eye closure  CN VIII: Hearing is normal to causal conversation. CN IX, X: Phonation is normal. CN XI: Head turning and shoulder shrug are intact  MOTOR: There is no pronator drift of out-stretched arms. Muscle bulk and tone are normal. Muscle strength is normal.  REFLEXES: Reflexes are 2+ and symmetric at the biceps, triceps, knees, and ankles. Plantar responses are flexor.  SENSORY: Intact to light touch, pinprick and vibratory sensation are intact in fingers and toes.  COORDINATION: There is no trunk or limb dysmetria noted.  GAIT/STANCE: Posture is normal. Gait is steady with normal steps, base, arm swing, and turning. Heel and toe walking are normal. Tandem gait is normal.  Romberg is absent.  REVIEW OF SYSTEMS:  Full 14 system review of systems performed and notable only for as above All other review of systems were negative.   ALLERGIES: No Known Allergies  HOME MEDICATIONS: Current Outpatient  Medications  Medication Sig Dispense Refill   atorvastatin (LIPITOR) 20 MG tablet Take 1 tablet (20 mg total) by mouth every evening. 90 tablet 3   Cholecalciferol (VITAMIN D PO) Take 1 tablet by mouth daily.     EPINEPHrine (EPIPEN) 0.3 mg/0.3 mL DEVI Inject 0.3 mLs (0.3 mg total) into the muscle as needed (anaphylaxis). 2 Device 1   Iodine, Kelp, (KELP PO) Take by mouth.     MAGNESIUM PO Take 1 tablet by mouth daily.     Multiple Vitamins-Minerals (ZINC PO) Take 1 tablet by mouth daily.     No current facility-administered medications for this visit.    PAST MEDICAL HISTORY: Past Medical History:  Diagnosis Date   Anaphylaxis    Hyperlipidemia     PAST SURGICAL HISTORY: Past Surgical History:  Procedure Laterality Date   THYROID SURGERY      FAMILY HISTORY: Family History  Problem Relation Age of Onset   Hypoparathyroidism Mother        patient   Cancer Mother    Hyperlipidemia Father    Colon cancer Father    Hypertension Father    Cancer Father    Heart attack Paternal Grandfather     SOCIAL HISTORY: Social History   Socioeconomic History   Marital status: Married    Spouse name: Not on file   Number of children: 0   Years of education: Not on file   Highest education level: Not on file  Occupational History   Occupation: Retired former Publishing copy  Tobacco Use   Smoking status: Never   Smokeless tobacco: Never  Substance and Sexual Activity   Alcohol use: No   Drug use: No   Sexual activity: Not on file  Other Topics Concern   Not on file  Social History Narrative   Not on file   Social Determinants of Health   Financial Resource Strain: Not on file  Food Insecurity: Not on file  Transportation Needs: Not on file  Physical Activity: Not on file  Stress: Not on file  Social Connections: Not on file  Intimate Partner Violence: Not on file      Marcial Pacas, M.D. Ph.D.  [facility] [address] [place],  [place] [address] Ph: [phone] Fax: [phone]  CC:  [name], [place],  [place] [address]  Patient, No Pcp Per (Inactive)

## 2021-08-07 ENCOUNTER — Telehealth: Payer: Self-pay | Admitting: Neurology

## 2021-08-07 NOTE — Telephone Encounter (Signed)
[employer] Josem Kaufmann: [id number] (exp. 08/07/21 to 10/05/21) order sent to [facility], patient has Medicare secondary.  I sent a message to Olin Hauser at GI to contact the patient as soon as possible tos chedule.

## 2021-08-08 NOTE — Telephone Encounter (Signed)
Patient is scheduled at GI for 08/14/21.

## 2021-08-14 ENCOUNTER — Other Ambulatory Visit: Payer: BC Managed Care – PPO

## 2021-08-30 ENCOUNTER — Ambulatory Visit: Payer: Self-pay | Admitting: Psychiatry

## 2022-06-22 ENCOUNTER — Other Ambulatory Visit: Payer: Self-pay | Admitting: Internal Medicine

## 2022-06-28 ENCOUNTER — Ambulatory Visit: Payer: Medicare Other | Admitting: Internal Medicine

## 2022-06-28 ENCOUNTER — Encounter: Payer: Self-pay | Admitting: Internal Medicine

## 2022-06-28 VITALS — BP 114/74 | HR 92 | Ht 70.0 in | Wt 174.2 lb

## 2022-06-28 DIAGNOSIS — E042 Nontoxic multinodular goiter: Secondary | ICD-10-CM | POA: Diagnosis not present

## 2022-06-28 DIAGNOSIS — Z9889 Other specified postprocedural states: Secondary | ICD-10-CM

## 2022-06-28 DIAGNOSIS — Z8639 Personal history of other endocrine, nutritional and metabolic disease: Secondary | ICD-10-CM

## 2022-06-28 DIAGNOSIS — E785 Hyperlipidemia, unspecified: Secondary | ICD-10-CM

## 2022-06-28 DIAGNOSIS — R5383 Other fatigue: Secondary | ICD-10-CM

## 2022-06-28 DIAGNOSIS — R7989 Other specified abnormal findings of blood chemistry: Secondary | ICD-10-CM | POA: Diagnosis not present

## 2022-06-28 NOTE — Patient Instructions (Signed)
Look up The Portfolio Diet for lowering your cholesterol.  Continue Lipitor 20 mg 3x a week.  You should have an endocrinology follow-up appointment in 1 year.

## 2022-06-28 NOTE — Progress Notes (Signed)
Patient ID: Carl Dunn, male   DOB: 02-02-1951, 71 y.o.   MRN: [id number]   HPI  Carl Dunn is a 71 y.o.-year-old male, returning for follow-up for thyroid nodules, h/o left hemithyroidectomy, and h/o primary hyperparathyroidism.  He is the husband of Carl Dunn, who is also my patient.  Last visit 1 year ago.  Interim history: When I first saw the patient, he was on many supplements, and felt tired, with mental fog.  We stopped some of them and he started to feel much better, but sxs recurred before our last OV, but he was still feeling much better, with plenty of energy.  At last OV, we restarted his statin.  He mentioned that he was not able to take it daily as he did not like how he felt on it, but he is taking it 3 times a week. Of note, he has hereditary hemochromatosis and has therapeutic phlebotomies. He had a recent attack - 5 weeks -he felt miserable.  At the same time, he had poison ivy on his arms after working in his garden >> severe >> Prednisone taper. After this he had mental fog, which improved. He is still on the Praxair with 16-8h fasting-eating window. On po B12 and also B12 injections.  Thyroid nodules: - dx'ed in ~2014 during investigation for primary hyperparathyroidism in the setting of kidney stones.  - The nodules appeared to have been stable over time.  He had serial ultrasounds with previous endocrinologist.   - On the latest ultrasound from 02/2018, no thyroid nodules were seen  As mentioned above, he also has a history of hyperparathyroidism and had parathyroidectomy in 2014 (2 glands removed - [name]).  At that time, a left nodule was resected (left subtotal thyroidectomy). Subsequently, he had 2 benign biopsies of his dominant right thyroid nodules.  Thyroid U/S (02/12/2017): Overall the gland is heterogeneous in echotexture consistent with nontoxic  multinodular goiter.  The isthmus measures: 0.47 cm, previously 0.57 cm, previously 0.40 cm.   The right lobe measures: 1.63 x 1.96 x 4.01 cm, previously 1.85 x 2.01 x  3.27 cm, previously 1.64 x 2.2 x 4.94 cm.  The left lobe measures: 1.75 x 1.43 x 3.20 cm, previously 1.14 x 1.26 x  2.46 cm, previously 1.3 x 1.28 x 3.13 cm.  Partial left hemithyroidectomy performed at parathyroid surgery for left sided nodule.     Nodules:  Right Nodule 1.  There is a spongiform appearing nodule in the superior  posterior lobe that measures 1.07 x 0.92 x 1.26 cm, perviously  1.42 x  1.57 x 1.60 cm, previously 1.28 x 1.31 x 1.61 cm, previously 1.35 x 1.46 x  1.53 cm. This nodule is stable and has previously been biopsied and  benign. There is peripheral and minimal internal flow by Doppler. Stable.     Right Nodule 3.  Inferior and medial there is a spongiform appearing  nodules that measures 1.33 x 1.65 x 1.67 cm, previously 1.41 x 1.25 x 2.09  cm, previously 1.4 x 2.06 x 1.69 cm, previously 1.81 x 2.05 x 1.60 cm. This nodule is stable. There is peripheral flow and minimal internal  vascularity.  There is a linear calcification internally in the nodule with posterior acoustic dropout measuring 0.28 cm, previously 0.33 cm, similar to previous at 0.26 cm.  This nodule has previously been sampled and benign.     Left Nodule 1.  Surgically removed at the time of parathyroidectomy. No additional left sided  thyroid nodules.     Lymph Node Assessment:  There are no abnormal lymph nodes seen in the left or right neck levels  II-VI including VA/VB     No parathyroid candidates seen, resolved.     Assessment and Plan:  1. Stable multinodular goiter as above.  2. Disposition. Repeat US in 12-24 months as needed   Thyroid U/S (02/24/2018, [facility]) - [name]: Small thyroid without nodules, but with some heterogeneous areas.   Pt denies: - feeling nodules in neck - hoarseness - dysphagia - choking  I reviewed pt's thyroid tests - he has a history of low TSH in 2019, which resolved: 06/27/2022: TSH  0.86, total T4 9.0 (5.5-11.8) 06/01/2022: TSH 0.63 Lab Results  Component Value Date   TSH 0.75 06/27/2021   TSH 1.02 02/10/2021   FREET4 0.76 06/27/2021   FREET4 0.72 02/10/2021  12/06/2020: TSH 0.461, normal 02/24/2018: TSH slightly low, at 0.317  He describes that in the past he was hypothyroid and prescribed Synthroid, but later stopped.  No FH of thyroid ds. No FH of thyroid cancer. No h/o radiation tx to head or neck.  She was on kelp before last visit, which was stopped. No steroid use. + Several supplements including DHEA, prostate supplement, zinc, copper.  + Biotin supplements -B complex.  Pt also has a history of primary hyperparathyroidism, for which he had parathyroidectomy in 2014.  PTH decreased from 121 before surgery to 20 after surgery.  Reviewed most recent pertinent labs: 06/27/2022: Vitamin D 42 06/01/2022: Calcium 8.5 (8.5-10.5) 06/14/2021: Calcium 9.3 02/10/2021: Ionized calcium 4.64 (4.8-5.6) 12/06/2020: Calcium 8.2 (8.5-10.5) 08/08/2017: Calcium 9.1, PTH 28 03/06/2016: Vitamin D 38 08/01/2015: Vitamin D 36 No results found for: "PTH"   Lab Results  Component Value Date   CALCIUM 9.0 10/23/2016   Lab Results  Component Value Date   VD25OH 45.4 06/27/2021   VD25OH 69.1 12/29/2020   He was on vitamin D 5800 units daily >> now varies the dose  - unclear exactly how much he is getting per day.   He has a history of hyperlipidemia 06/27/2022: 191/122/59/122 Lab Results  Component Value Date   CHOL 232 (H) 06/27/2021   HDL 55.90 06/27/2021   LDLCALC 158 (H) 06/27/2021   TRIG 92.0 06/27/2021   CHOLHDL 4 06/27/2021   -Previously on on Lipitor 20 -but off at last visit.  At that time, I advised him to restart it - taking this 3x a week as he did not like how he felt on it.  He also has a history of hereditary hemochromatosis.  He previously had regular phlebotomies. he is AST was high, but per review of previous abdominal imaging, there were no masses in his  liver. He had stage 1a melanoma (leg). He also has a history of gout  He is on Clomid 25 mg daily.  Testosterone from 12/26/2020 was normal. He saw Dr. Gaynelle Arabian with urology. He does not have a PCP and would not want to establish care with one.  ROS: + See HPI  I reviewed pt's medications, allergies, PMH, social hx, family hx, and changes were documented in the history of present illness. Otherwise, unchanged from my initial visit note.  Past Medical History:  Diagnosis Date   Anaphylaxis    Hyperlipidemia    Past Surgical History:  Procedure Laterality Date   THYROID SURGERY     Social History   Socioeconomic History   Marital status: Married    Spouse name: Not on  file   Number of children: 0   Years of education: Not on file   Highest education level: Not on file  Occupational History   Occupation: Retired former Publishing copy  Tobacco Use   Smoking status: Never   Smokeless tobacco: Never  Substance and Sexual Activity   Alcohol use: No   Drug use: No   Sexual activity: Not on file  Other Topics Concern   Not on file  Social History Narrative   Not on file   Social Determinants of Health   Financial Resource Strain: Not on file  Food Insecurity: Not on file  Transportation Needs: Not on file  Physical Activity: Not on file  Stress: Not on file  Social Connections: Not on file  Intimate Partner Violence: Not on file   Current Outpatient Medications on File Prior to Visit  Medication Sig Dispense Refill   atorvastatin (LIPITOR) 20 MG tablet TAKE 1 TABLET BY MOUTH EVERY EVENING. 90 tablet 3   Cholecalciferol (VITAMIN D PO) Take 1 tablet by mouth daily.     EPINEPHrine (EPIPEN) 0.3 mg/0.3 mL DEVI Inject 0.3 mLs (0.3 mg total) into the muscle as needed (anaphylaxis). 2 Device 1   Iodine, Kelp, (KELP PO) Take by mouth.     MAGNESIUM PO Take 1 tablet by mouth daily.     Multiple Vitamins-Minerals (ZINC PO) Take 1 tablet by mouth daily.     No current  facility-administered medications on file prior to visit.   No Known Allergies Family History  Problem Relation Age of Onset   Hypoparathyroidism Mother        patient   Cancer Mother    Hyperlipidemia Father    Colon cancer Father    Hypertension Father    Cancer Father    Heart attack Paternal Grandfather    PE: BP 114/74 (BP Location: Right Arm, Patient Position: Sitting, Cuff Size: Normal)   Pulse 92   Ht '5\' 10"'$  (1.778 m)   Wt 174 lb 3.2 oz (79 kg)   SpO2 99%   BMI 25.00 kg/m  Wt Readings from Last 3 Encounters:  06/28/22 174 lb 3.2 oz (79 kg)  08/03/21 165 lb (74.8 kg)  06/27/21 169 lb 6.4 oz (76.8 kg)   Constitutional: normal weight, in NAD Eyes: EOMI, no exophthalmos ENT: moist mucous membranes, no thyromegaly, no cervical lymphadenopathy Cardiovascular: RRR, No MRG Respiratory: CTA B Musculoskeletal: no deformities Skin: moist, warm, no rashes Neurological: no tremor with outstretched hands  ASSESSMENT: 1. H/o Thyroid nodules  2. H/o primary hyperparathyroidism - s/p parathyroidectomy  3.  Low serum calcium  4.  History of a low TSH  5.  Mild fatigue  6.  Hyperlipidemia  PLAN: 1. H/o Thyroid nodules -Patient has a history of several thyroid nodules, but latest thyroid ultrasound showed a small thyroid without any detectable nodules.  He previously had a left partial lobectomy at the time of his parathyroidectomy in 2014, during which one of the nodules were removed.  2 other nodules in the right thyroid lobe have been sampled in the past with benign results.  These have been stable since 2016 to 2018 and, as mentioned above, however, not clearly detectable on the ultrasound from 2019.  We discussed that this could have been just areas of inflammation (pseudonodule). -Has no family history of thyroid cancer or history of radiation therapy to head or neck -No masses felt on palpation of his neck today and also no neck compression symptoms, but he does  have  some postnasal drip due to allergies -No further investigation is needed for this.  2.  History of primary hyperparathyroidism and 3.  Low serum calcium -Previously seen at [facility] -The abnormal parathyroid were not visualized before the surgery so he had exploratory parathyroidectomy - 2 glands resected in 2014 -Calcium on 12/06/2020 was slightly low, at 8.2 (8.5-10.5); at last visit, an ionized calcium was also slightly low, at 4.64 (4.8-5.6).  -We discussed that target is a calcium that was in the lower part of the normal range.  If he has a value slightly lower than the target range, this is acceptable, also -Most recent calcium was reviewed and this was normal, at 8.5.  Vitamin D level was also normal recently, at 42.  4.  History of a low TSH - in 2019 he had a mildly low TSH,  0.3 -This could be related to kelp or biotin use -At last visit TFTs were normal -He also had another TSH level checked last month and 1 this month and they were both normal  5.  Mild fatigue -He had fatigue and mental fog around the time of his hemochromatosis attack + prednisone treatment for severe allergic reaction to poison ivy.  The symptoms lasted for months.  Now resolved. -Latest TFTs were normal at last visit and again yesterday -At visit, B12 vitamin and vitamin D were also normal: 408, and 45.4, respectively -He had another vitamin D B12 level checked yesterday and this was undetectably high, while vitamin D was normal, at 42  6.  Hyperlipidemia -Previously on a statin but was off before last visit. - Reviewed latest lipid panel from yesterday: Improved LDL:  191/122/59/122 -At that time, I advised him to start Lipitor 20 mg daily.  He is taking a 3 times a week  -He is wondering how he can improve his diet to hopefully improve his LDL further.  I suggested the portfolio diet.  He is already on the Praxair.  Philemon Kingdom, MD PhD [facility] Endocrinology

## 2022-07-30 ENCOUNTER — Telehealth: Payer: Self-pay | Admitting: Neurology

## 2022-07-30 NOTE — Telephone Encounter (Signed)
Patient is requesting switch from Dr. Krista Blue to Dr. Brett Fairy. Last seen by Dr. Krista Blue 07/2021 for memory issues. Now being referred to [place] for brain fog, memory loss, fatigue. Per p[rovider notes-"he reports issues with sleepiness during the day with reports of him snoring at nighttime, increase in tinnitus, and changes in vision in right eye. Recommend f/u with neurology for possible sleep study to exclude underlying central sleep apnea, carotid dopplers given vision changes and hx of hyperlipidemia, and also possible brain MRI to exclude underlying neurological disease." I offered to schedule patient with Dr. Krista Blue for a follow up on neuro issues and he and wife declined stated they would like him to have all of his care with Dr. Brett Fairy as the wife Sharyn Lull sees Dr. Brett Fairy as well. Please advise if this is ok for a switch thank you

## 2022-08-22 ENCOUNTER — Encounter: Payer: Self-pay | Admitting: Podiatry

## 2022-08-22 ENCOUNTER — Ambulatory Visit: Payer: Medicare Other | Admitting: Podiatry

## 2022-08-22 DIAGNOSIS — G5792 Unspecified mononeuropathy of left lower limb: Secondary | ICD-10-CM | POA: Diagnosis not present

## 2022-08-22 DIAGNOSIS — M25572 Pain in left ankle and joints of left foot: Secondary | ICD-10-CM | POA: Diagnosis not present

## 2022-08-22 DIAGNOSIS — M722 Plantar fascial fibromatosis: Secondary | ICD-10-CM | POA: Diagnosis not present

## 2022-08-22 DIAGNOSIS — E0590 Thyrotoxicosis, unspecified without thyrotoxic crisis or storm: Secondary | ICD-10-CM | POA: Insufficient documentation

## 2022-08-22 MED ORDER — DEXAMETHASONE SODIUM PHOSPHATE 120 MG/30ML IJ SOLN
2.0000 mg | Freq: Once | INTRAMUSCULAR | Status: AC
  Administered 2022-08-22: 2 mg via INTRA_ARTICULAR

## 2022-08-23 ENCOUNTER — Encounter: Payer: Self-pay | Admitting: Neurology

## 2022-08-23 ENCOUNTER — Telehealth: Payer: Self-pay | Admitting: Neurology

## 2022-08-23 ENCOUNTER — Ambulatory Visit: Payer: Medicare Other | Admitting: Neurology

## 2022-08-23 VITALS — BP 108/74 | HR 101 | Ht 70.0 in | Wt 172.5 lb

## 2022-08-23 DIAGNOSIS — E210 Primary hyperparathyroidism: Secondary | ICD-10-CM

## 2022-08-23 DIAGNOSIS — G3184 Mild cognitive impairment, so stated: Secondary | ICD-10-CM

## 2022-08-23 DIAGNOSIS — E0590 Thyrotoxicosis, unspecified without thyrotoxic crisis or storm: Secondary | ICD-10-CM

## 2022-08-23 DIAGNOSIS — F909 Attention-deficit hyperactivity disorder, unspecified type: Secondary | ICD-10-CM

## 2022-08-23 DIAGNOSIS — G4733 Obstructive sleep apnea (adult) (pediatric): Secondary | ICD-10-CM | POA: Insufficient documentation

## 2022-08-23 DIAGNOSIS — J0140 Acute pansinusitis, unspecified: Secondary | ICD-10-CM

## 2022-08-23 DIAGNOSIS — Z9189 Other specified personal risk factors, not elsewhere classified: Secondary | ICD-10-CM | POA: Diagnosis not present

## 2022-08-23 DIAGNOSIS — R0683 Snoring: Secondary | ICD-10-CM

## 2022-08-23 NOTE — Patient Instructions (Signed)
Attention Deficit Hyperactivity Disorder, Adult Attention deficit hyperactivity disorder (ADHD) is a mental health disorder that starts during childhood. For many people with ADHD, the disorder continues into the adult years. Treatment can help you manage your symptoms. There are three main types of ADHD: Inattentive. With this type, adults have difficulty paying attention. This may affect cognitive abilities. Hyperactive-impulsive. With this type, adults have a lot of energy and have difficulty controlling their behavior. Combination type. Some people may have symptoms of both types. What are the causes? The exact cause of ADHD is not known. Most experts believe a person's genes and environment possibly contribute to ADHD. What increases the risk? The following factors may make you more likely to develop this condition: Having a first-degree relative such as a parent, brother, or sister, with the condition. Being born before 37 weeks of pregnancy (prematurely) or at a low birth weight. Being born to a mother who smoked tobacco or drank alcohol during pregnancy. Having experienced a brain injury. Being exposed to lead or other toxins in the womb or early in life. What are the signs or symptoms? Symptoms of this condition depend on the type of ADHD. Symptoms of the inattentive type include: Difficulty paying attention or following instructions. Often making simple mistakes. Being disorganized. Avoiding tasks that require time and attention. Losing and forgetting things. Symptoms of the hyperactive-impulsive type include: Restlessness. Talking out of turn, interrupting others, or talking too much. Difficulty with: Sitting still. Feeling motivated. Relaxing. Waiting in line or waiting for a turn. People with the combination type have symptoms of both of the other types. In adults, this condition may lead to certain problems, such as: Keeping jobs. Performing tasks at work. Having  stable relationships. Being on time or keeping to a schedule. How is this diagnosed? This condition is diagnosed based on your current symptoms and your history of symptoms. The diagnosis can be made by a health care provider such as a primary care provider or a mental health care specialist. Your health care provider may use a symptom checklist or a behavior rating scale to evaluate your symptoms. Your health care provider may also want to talk with people who have observed your behaviors throughout your life. How is this treated? This condition can be treated with medicines and behavior therapy. Medicines may be the best option to reduce impulsive behaviors and improve attention. Your health care provider may recommend: Stimulant medicines. These are the most common medicines used for adult ADHD. They affect certain chemicals in the brain (neurotransmitters) and improve your ability to control your symptoms. A non-stimulant medicine. These medicines can also improve focus, attention, and impulsive behavior. It may take weeks to months to see the effects of this medicine. Counseling and behavioral management are also important for treating ADHD. Counseling is often used along with medicine. Your health care provider may suggest: Cognitive behavioral therapy (CBT). This type of therapy teaches you to replace negative thoughts and actions with positive thoughts and actions. When used as part of ADHD treatment, this therapy may also include: Coping strategies for organization, time management, impulse control, and stress reduction. Mindfulness and meditation training. Behavioral management. You may work with a coach who is specially trained to help people with ADHD manage and organize activities and function more effectively. Follow these instructions at home: Medicines  Take over-the-counter and prescription medicines only as told by your health care provider. Talk with your health care provider  about the possible side effects of your medicines and   how to manage them. Alcohol use Do not drink alcohol if: Your health care provider tells you not to drink. You are pregnant, may be pregnant, or are planning to become pregnant. If you drink alcohol: Limit how much you use to: 0-1 drink a day for women. 0-2 drinks a day for men. Know how much alcohol is in your drink. In the [place] one drink equals one 12 oz bottle of beer (355 mL), one 5 oz glass of wine (148 mL), or one 1 oz glass of hard liquor (44 mL). Lifestyle  Do not use illegal drugs. Get enough sleep. Eat a healthy diet. Exercise regularly. Exercise can help to reduce stress and anxiety. General instructions Learn as much as you can about adult ADHD, and work closely with your health care providers to find the treatments that work best for you. Follow the same schedule each day. Use reminder devices like notes, calendars, and phone apps to stay on time and organized. Keep all follow-up visits. Your health care provider will need to monitor your condition and adjust your treatment over time. Where to find more information A health care provider may be able to recommend resources that are available online or over the phone. You could start with: Attention Deficit Disorder Association (ADDA): [employer]cy National Institute of Mental Health [facility]): https://www.frey.org/ Contact a health care provider if: Your symptoms continue to cause problems. You have side effects from your medicine, such as: Repeated muscle twitches, coughing, or speech outbursts. Sleep problems. Loss of appetite. Dizziness. Unusually fast heartbeat. Stomach pains. Headaches. You are struggling with anxiety, depression, or substance abuse. Get help right away if: You have a severe reaction to a medicine. This symptom may be an emergency. Get help right away. Call 911. Do not wait to see if the symptom will go away. Do not drive yourself to the hospital. Take  one of these steps if you feel like you may hurt yourself or others, or have thoughts about taking your own life: Go to your nearest emergency room. Call 911. Call the [place] at [phone] or 988. This is open 24 hours a day Text the Crisis Text Line at 8197994636. Summary ADHD is a mental health disorder that starts during childhood and often continues into your adult years. The exact cause of ADHD is not known. Most experts believe genetics and environmental factors contribute to ADHD. There is no cure for ADHD, but treatment with medicine, cognitive behavioral therapy, or behavioral management can help you manage your condition. This information is not intended to replace advice given to you by your health care provider. Make sure you discuss any questions you have with your health care provider. Document Revised: 01/26/2022 Document Reviewed: 01/26/2022 Elsevier Patient Education  [place]. Management of Memory Problems  There are some general things you can do to help manage your memory problems.  Your memory may not in fact recover, but by using techniques and strategies you will be able to manage your memory difficulties better.  1)  Establish a routine. Try to establish and then stick to a regular routine.  By doing this, you will get used to what to expect and you will reduce the need to rely on your memory.  Also, try to do things at the same time of day, such as taking your medication or checking your calendar first thing in the morning. Think about think that you can do as a part of a regular routine and make a  list.  Then enter them into a daily planner to remind you.  This will help you establish a routine.  2)  Organize your environment. Organize your environment so that it is uncluttered.  Decrease visual stimulation.  Place everyday items such as keys or cell phone in the same place every day (ie.  Basket next to front door) Use post it  notes with a brief message to yourself (ie. Turn off light, lock the door) Use labels to indicate where things go (ie. Which cupboards are for food, dishes, etc.) Keep a notepad and pen by the telephone to take messages  3)  Memory Aids A diary or journal/notebook/daily planner Making a list (shopping list, chore list, to do list that needs to be done) Using an alarm as a reminder (kitchen timer or cell phone alarm) Using cell phone to store information (Notes, Calendar, Reminders) Calendar/White board placed in a prominent position Post-it notes  In order for memory aids to be useful, you need to have good habits.  It's no good remembering to make a note in your journal if you don't remember to look in it.  Try setting aside a certain time of day to look in journal.  4)  Improving mood and managing fatigue. There may be other factors that contribute to memory difficulties.  Factors, such as anxiety, depression and tiredness can affect memory. Regular gentle exercise can help improve your mood and give you more energy. Simple relaxation techniques may help relieve symptoms of anxiety Try to get back to completing activities or hobbies you enjoyed doing in the past. Learn to pace yourself through activities to decrease fatigue. Find out about some local support groups where you can share experiences with others. Try and achieve 7-8 hours of sleep at night.Healthy Living: Sleep In this video, you will learn why sleep is an important part of a healthy lifestyle. To view the content, go to this web address: https://pe.elsevier.(262)044-7360  This video will expire on: 06/26/2024. If you need access to this video following this date, please reach out to the healthcare provider who assigned it to you. This information is not intended to replace advice given to you by your health care provider. Make sure you discuss any questions you have with your health care provider. Elsevier Patient Education   [place].

## 2022-08-23 NOTE — Telephone Encounter (Signed)
[employer] medicare NPR sent to GI [phone] 

## 2022-08-23 NOTE — Progress Notes (Signed)
SLEEP MEDICINE CLINIC    Provider:  Larey Seat, MD  Primary Care Physician:  Pcp, No No address on file     Referring Provider: [name], [place],  [place] [address]          Chief Complaint according to patient   Patient presents with:     New Patient (Initial Visit)           HISTORY OF PRESENT ILLNESS:  Carl Dunn is a 71 y.o. Caucasian male patient seen here as a referral on 08/23/2022 from [place] for a new evaluation of memory loss. .  Chief concern according to patient :  " Memory concerns , had seen Dr. Krista Blue 2022, now referred  by [facility] for sleep study, MRI brain and re- testing of his memory as well. "    Carl Dunn  has a past medical history of Anaphylaxis, MCI  and Hyperlipidemia. Short term memory loss.     Sleep relevant medical history: Insomnia,  parathyroid surgery, tinnitus, hearing loss, wisdom teeth extraction in his 85s.    Family medical /sleep history: both parents died f cancer before age 49, CAD in paternal side, PGM passed of a brain cancer. MGF lived until 26 , COPD, MGM died in 27 of suicide , no other family member on CPAP with OSA, insomnia, no sleep walkers.    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  ETOH use : seldomly,  Caffeine intake in form of Coffee( daily in AM ) Soda( /) Tea ( green tea one a day - ) or energy drinks. Regular exercise in form of - foresting, yard work.        Sleep habits are as follows: The patient's dinner time is between 3-5  PM. Dr Lelon Mast diet, The patient goes to bed at 9-10 PM and no trouble falling asleep- continues to sleep for 6-7 hours, wakes for 0-1 bathroom break. He may wake up at 2-3 AM and will read -  The preferred sleep position is supine , with the support of 1 pillow in a  flat bed.  Dreams are reportedly rare.   6 AM is the usual rise time. The patient wakes up spontaneously between 5-6AM He reports feeling refreshed or restored in AM, with symptoms such as dry mouth, and residual fatigue.  Naps are taken infrequently, not scheduled, lasting from 30 to 45 minutes -refreshing than nocturnal sleep.    Review of Systems: Out of a complete 14 system review, the patient complains of only the following symptoms, and all other reviewed systems are negative.:  Fatigue, sleepiness , snoring, fragmented sleep, rare Insomnia    How likely are you to doze in the following situations: 0 = not likely, 1 = slight chance, 2 = moderate chance, 3 = high chance   Sitting and Reading? Watching Television? Sitting inactive in a public place (theater or meeting)? As a passenger in a car for an hour without a break? Lying down in the afternoon when circumstances permit? Sitting and talking to someone? Sitting quietly after lunch without alcohol? In a car, while stopped for a few minutes in traffic?   Total = 0/ 24 points   FSS endorsed at 15/ 63 points.   Pressured speech, tangential,  logorrhea.   Social History   Socioeconomic History   Marital status: Married    Spouse name: Not on file   Number of children: 0   Years of education: Not on file   Highest education level: Not on file  Occupational History   Occupation: Retired former Publishing copy  Tobacco Use   Smoking status: Never   Smokeless tobacco: Never  Substance and Sexual Activity   Alcohol use: No   Drug use: No   Sexual activity: Not on file  Other Topics Concern   Not on file  Social History Narrative   Not on file   Social Determinants of Health   Financial Resource Strain: Not on file  Food Insecurity: Not on file  Transportation Needs: Not on file  Physical Activity: Not on file  Stress: Not on file  Social Connections: Not on file    Family History  Problem Relation Age of Onset    Hypoparathyroidism Mother        patient   Cancer Mother    Hyperlipidemia Father    Colon cancer Father    Hypertension Father    Cancer Father    Heart attack Paternal Grandfather     Past Medical History:  Diagnosis Date   Anaphylaxis    Hyperlipidemia     Past Surgical History:  Procedure Laterality Date   THYROID SURGERY       Current Outpatient Medications on File Prior to Visit  Medication Sig Dispense Refill   Cholecalciferol (VITAMIN D PO) Take 1 tablet by mouth daily.     EPINEPHrine (EPIPEN) 0.3 mg/0.3 mL DEVI Inject 0.3 mLs (0.3 mg total) into the muscle as needed (anaphylaxis). 2 Device 1   Iodine, Kelp, (KELP PO) Take by mouth.     MAGNESIUM PO Take 1 tablet by mouth daily.     Multiple Vitamins-Minerals (ZINC PO) Take 1 tablet by mouth daily.     atorvastatin (LIPITOR) 20 MG tablet TAKE 1 TABLET BY MOUTH EVERY EVENING. (Patient not taking: Reported on 08/23/2022) 90 tablet 3   No current facility-administered medications on file prior to visit.    No Known Allergies  Physical exam:  Today's Vitals   08/23/22 1347  BP: 108/74  Pulse: (!) 101  Weight: 172 lb 8 oz (78.2 kg)  Height: '5\' 10"'$  (1.778 m)   Body mass index is 24.75 kg/m.   Wt Readings from Last 3 Encounters:  08/23/22 172 lb 8 oz (78.2 kg)  06/28/22 174 lb 3.2 oz (79 kg)  08/03/21 165 lb (74.8 kg)     Ht Readings from Last 3 Encounters:  08/23/22 '5\' 10"'$  (1.778 m)  06/28/22 '5\' 10"'$  (1.778 m)  08/03/21 '5\' 10"'$  (1.778 m)      General: The patient is awake, alert and appears not in acute distress. The patient is well groomed. Head: Normocephalic, atraumatic.  Neck is supple. Mallampati 3,  neck circumference:16 inches . Nasal airflow patent.   Retrognathia is seen.  Dental status: crowded, crossbite, retrognathia- small mouth . Cardiovascular:  Regular rate and cardiac rhythm by pulse,  without distended neck veins. Respiratory: Lungs are clear to auscultation.  Skin:  Without  evidence of ankle edema, or rash. Trunk: The patient's posture is erect.   Neurologic exam : The patient is awake and alert, oriented to place and time.   Memory subjective described as impaired- but his perception is different form his wife's-  Short term memory is most affected.  No data to display              08/23/2022    1:49 PM 08/03/2021    2:03 PM  Montreal Cognitive Assessment   Visuospatial/ Executive (0/5) 5 4  Naming (0/3) 3 3  Attention: Read list of digits (0/2) 2 2  Attention: Read list of letters (0/1) 0 1  Attention: Serial 7 subtraction starting at 100 (0/3) 2 3  Language: Repeat phrase (0/2) 2 2  Language : Fluency (0/1) 1 1  Abstraction (0/2) 2 2  Delayed Recall (0/5) 2 1  Orientation (0/6) 6 6  Total 26 25  Adjusted Score (based on education) 26 25      Attention span & concentration ability appears very limited, distractible, tangential and and veering off target.  Speech is fluent,  without  dysarthria, with mild  dysphonia , no aphasia.  Mood and affect are pressured, talking without stop.    Cranial nerves: no loss of smell or taste reported  Pupils are equal and briskly reactive to light. Funduscopic exam deferred.  Extraocular movements in vertical and horizontal planes were intact and without nystagmus. No Diplopia. Visual fields by finger perimetry are intact. Hearing was impaired  to soft voice and finger rubbing.   Tuning fork n bone conduction louder on the left.   Facial sensation intact to fine touch.  Facial motor strength is symmetric and tongue and uvula move midline.  Neck ROM : rotation, tilt and flexion extension were normal for age and shoulder shrug was symmetrical.    Motor exam:  Symmetric bulk, tone and ROM.   Normal tone without cog- wheeling, symmetric grip strength .   Sensory:  Fine touch, pinprick and vibration were normal.  Proprioception tested in the upper extremities was normal.   Coordination: Rapid  alternating movements in the fingers/hands were of normal speed.  The Finger-to-nose maneuver was intact without evidence of ataxia, dysmetria or tremor.   Gait and station: Patient could rise unassisted from a seated position, walked without assistive device.  Stance is of normal width/ base and the patient turned with 3 steps.  Toe and heel walk were deferred.  Deep tendon reflexes: in the  upper and lower extremities are symmetric and intact.  Babinski response was deferred .       After spending a total time of  50  minutes face to face and additional time for physical and neurologic examination, review of laboratory studies,  personal review of imaging studies, reports and results of other testing and review of referral information / records as far as provided in visit, I have established the following assessments:  1)  short term memory loss 2)  ? ADHD - impulsive, tangential, he should be tested.  3)  snoring, witnessed apnea  4) sinuitis, rhinitis, small upper airway, retrognathia -    My Plan is to proceed with:  1) MRI  brain with and without contrast  2) GNA panel for dementia  3) referral for ADHD testing versus mania.  4) screening for OSA.   I would like to thank Pcp, No and [name], [place] [address] [place],  [place] [address] for allowing me to meet with and to take care of this pleasant patient.   In short, Carl Dunn is presenting with short term memory loss, hyperactivity, impulsivity, and snoring  I plan to follow up either personally or through our NP within 3-4 months.   CC: I will share my notes  with PCP .  Electronically signed by: Larey Seat, MD 08/23/2022 2:29 PM  [facility] Board certified by The [employer] of Sleep Medicine and Diplomate of the [employer] of Sleep Medicine. Board certified In Neurology through the [place], Fellow of the [employer] of Neurology. Medical Director  of [employer].

## 2022-08-23 NOTE — Progress Notes (Signed)
  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 male presents with the above complaint.   ROS: Denies fever chills nausea vomiting muscle aches pains calf pain back pain chest pain shortness of breath.  Past Medical History:  Diagnosis Date   Anaphylaxis    Hyperlipidemia    Past Surgical History:  Procedure Laterality Date   THYROID SURGERY      Current Outpatient Medications:    atorvastatin (LIPITOR) 20 MG tablet, TAKE 1 TABLET BY MOUTH EVERY EVENING., Disp: 90 tablet, Rfl: 3   Cholecalciferol (VITAMIN D PO), Take 1 tablet by mouth daily., Disp: , Rfl:    EPINEPHrine (EPIPEN) 0.3 mg/0.3 mL DEVI, Inject 0.3 mLs (0.3 mg total) into the muscle as needed (anaphylaxis)., Disp: 2 Device, Rfl: 1   Iodine, Kelp, (KELP PO), Take by mouth., Disp: , Rfl:    MAGNESIUM PO, Take 1 tablet by mouth daily., Disp: , Rfl:    Multiple Vitamins-Minerals (ZINC PO), Take 1 tablet by mouth daily., Disp: , Rfl:   No Known Allergies Review of Systems Objective:  There were no vitals filed for this visit.  General: Well developed, nourished, in no acute distress, alert and oriented x3   Dermatological: Skin is warm, dry and supple bilateral. Nails x 10 are well maintained; remaining integument appears unremarkable at this time. There are no open sores, no preulcerative lesions, no rash or signs of infection present.  Vascular: Dorsalis Pedis artery and Posterior Tibial artery pedal pulses are 2/4 bilateral with immedate capillary fill time. Pedal hair  growth present. No varicosities and no lower extremity edema present bilateral.   Neruologic: Grossly intact via light touch bilateral. Vibratory intact via tuning fork bilateral. Protective threshold with Semmes Wienstein monofilament intact to all pedal sites bilateral. Patellar and Achilles deep tendon reflexes 2+ bilateral. No Babinski or clonus noted bilateral.   Musculoskeletal: No gross boney pedal deformities bilateral. No pain, crepitus, or limitation noted with foot and ankle range of motion bilateral. Muscular strength 5/5 in all groups tested bilateral.  He has tenderness on palpation of the medial plantar aspect of the hallux over the distal phalanx with a mild area of hyper keratosis.  Pinpoint tenderness with radiating pain consistent with neuritis.  Gait: Unassisted, Nonantalgic.    Radiographs:  None taken  Assessment & Plan:   Assessment: Neuritis hallux left  Plan: Discussed appropriate shoe gear stretching exercise ice therapy shoe modifications.  I injected 2 mg of dexamethasone local anesthetic today to point maximal tenderness.  He tolerated procedure well without complications follow-up with me on a as needed basis.     Delynn Olvera T. [place], [place]

## 2022-08-25 ENCOUNTER — Encounter: Payer: Self-pay | Admitting: Neurology

## 2022-08-25 DIAGNOSIS — G3184 Mild cognitive impairment, so stated: Secondary | ICD-10-CM

## 2022-08-28 ENCOUNTER — Other Ambulatory Visit: Payer: Self-pay | Admitting: Neurology

## 2022-08-28 ENCOUNTER — Encounter: Payer: Self-pay | Admitting: Psychology

## 2022-08-28 ENCOUNTER — Telehealth: Payer: Self-pay | Admitting: *Deleted

## 2022-08-28 ENCOUNTER — Telehealth: Payer: Self-pay | Admitting: Neurology

## 2022-08-28 ENCOUNTER — Encounter: Payer: Self-pay | Admitting: Neurology

## 2022-08-28 DIAGNOSIS — W57XXXA Bitten or stung by nonvenomous insect and other nonvenomous arthropods, initial encounter: Secondary | ICD-10-CM

## 2022-08-28 DIAGNOSIS — W57XXXD Bitten or stung by nonvenomous insect and other nonvenomous arthropods, subsequent encounter: Secondary | ICD-10-CM

## 2022-08-28 LAB — COMPREHENSIVE METABOLIC PANEL
ALT: 20 IU/L (ref 0–44)
AST: 17 IU/L (ref 0–40)
Albumin/Globulin Ratio: 2.2 (ref 1.2–2.2)
Albumin: 4.3 g/dL (ref 3.9–4.9)
Alkaline Phosphatase: 68 IU/L (ref 44–121)
BUN/Creatinine Ratio: 25 — ABNORMAL HIGH (ref 10–24)
BUN: 28 mg/dL — ABNORMAL HIGH (ref 8–27)
Bilirubin Total: 0.3 mg/dL (ref 0.0–1.2)
CO2: 24 mmol/L (ref 20–29)
Calcium: 9 mg/dL (ref 8.6–10.2)
Chloride: 103 mmol/L (ref 96–106)
Creatinine, Ser: 1.1 mg/dL (ref 0.76–1.27)
Globulin, Total: 2 g/dL (ref 1.5–4.5)
Glucose: 94 mg/dL (ref 70–99)
Potassium: 4.7 mmol/L (ref 3.5–5.2)
Sodium: 146 mmol/L — ABNORMAL HIGH (ref 134–144)
Total Protein: 6.3 g/dL (ref 6.0–8.5)
eGFR: 72 mL/min/{1.73_m2} (ref >59)

## 2022-08-28 LAB — MULTIPLE MYELOMA PANEL, SERUM
Albumin SerPl Elph-Mcnc: 3.7 g/dL (ref 2.9–4.4)
Albumin/Glob SerPl: 1.5 (ref 0.7–1.7)
Alpha 1: 0.2 g/dL (ref 0.0–0.4)
Alpha2 Glob SerPl Elph-Mcnc: 0.7 g/dL (ref 0.4–1.0)
B-Globulin SerPl Elph-Mcnc: 1 g/dL (ref 0.7–1.3)
Gamma Glob SerPl Elph-Mcnc: 0.7 g/dL (ref 0.4–1.8)
Globulin, Total: 2.6 g/dL (ref 2.2–3.9)
IgA/Immunoglobulin A, Serum: 223 mg/dL (ref 61–437)
IgG (Immunoglobin G), Serum: 755 mg/dL (ref 603–1613)
IgM (Immunoglobulin M), Srm: 32 mg/dL (ref 20–172)

## 2022-08-28 LAB — CBC WITH DIFFERENTIAL/PLATELET
Basophils Absolute: 0.1 10*3/uL (ref 0.0–0.2)
Basos: 1 %
EOS (ABSOLUTE): 0.2 10*3/uL (ref 0.0–0.4)
Eos: 2 %
Hematocrit: 45.7 % (ref 37.5–51.0)
Hemoglobin: 15.8 g/dL (ref 13.0–17.7)
Immature Grans (Abs): 0 10*3/uL (ref 0.0–0.1)
Immature Granulocytes: 0 %
Lymphocytes Absolute: 3.5 10*3/uL — ABNORMAL HIGH (ref 0.7–3.1)
Lymphs: 30 %
MCH: 32.4 pg (ref 26.6–33.0)
MCHC: 34.6 g/dL (ref 31.5–35.7)
MCV: 94 fL (ref 79–97)
Monocytes Absolute: 0.9 10*3/uL (ref 0.1–0.9)
Monocytes: 7 %
Neutrophils Absolute: 7 10*3/uL (ref 1.4–7.0)
Neutrophils: 60 %
Platelets: 331 10*3/uL (ref 150–450)
RBC: 4.87 x10E6/uL (ref 4.14–5.80)
RDW: 12.1 % (ref 11.6–15.4)
WBC: 11.7 10*3/uL — ABNORMAL HIGH (ref 3.4–10.8)

## 2022-08-28 LAB — SJOGREN'S SYNDROME ANTIBODS(SSA + SSB)
ENA SSA (RO) Ab: 0.2 AI (ref 0.0–0.9)
ENA SSB (LA) Ab: <0.2 AI (ref 0.0–0.9)

## 2022-08-28 LAB — HOMOCYSTEINE: Homocysteine: 9.9 umol/L (ref 0.0–17.2)

## 2022-08-28 LAB — VITAMIN B12: Vitamin B-12: 685 pg/mL (ref 232–1245)

## 2022-08-28 LAB — TSH: TSH: 0.792 u[IU]/mL (ref 0.450–4.500)

## 2022-08-28 LAB — RPR: RPR Ser Ql: NONREACTIVE

## 2022-08-28 LAB — C-REACTIVE PROTEIN: CRP: <1 mg/L (ref 0–10)

## 2022-08-28 LAB — HEMOGLOBIN A1C
Est. average glucose Bld gHb Est-mCnc: 117 mg/dL
Hgb A1c MFr Bld: 5.7 % — ABNORMAL HIGH (ref 4.8–5.6)

## 2022-08-28 LAB — SEDIMENTATION RATE: Sed Rate: 3 mm/hr (ref 0–30)

## 2022-08-28 LAB — HEPATITIS B SURFACE ANTIBODY,QUALITATIVE: Hep B Surface Ab, Qual: REACTIVE

## 2022-08-28 LAB — ANA W/REFLEX: ANA Titer 1: NEGATIVE

## 2022-08-28 NOTE — Telephone Encounter (Signed)
Referral was put in on 11/1 to [place] if you want to call and check on it

## 2022-08-28 NOTE — Telephone Encounter (Signed)
HST- [employer] medicare no auth req.  Patient is scheduled at [facility] for 09/18/22 at 8:30 AM.  Mailed packet to the patient.

## 2022-08-28 NOTE — Telephone Encounter (Signed)
Patient is calling for status of his PT referral,had not heard anything.

## 2022-08-28 NOTE — Telephone Encounter (Signed)
Called and they faxed the referral to [facility] in [place]), are working thru que, will contact patient as soon as possible, patient aware

## 2022-08-29 ENCOUNTER — Other Ambulatory Visit: Payer: Self-pay | Admitting: *Deleted

## 2022-08-29 DIAGNOSIS — R413 Other amnesia: Secondary | ICD-10-CM

## 2022-08-29 DIAGNOSIS — G3184 Mild cognitive impairment, so stated: Secondary | ICD-10-CM

## 2022-08-30 ENCOUNTER — Other Ambulatory Visit (INDEPENDENT_AMBULATORY_CARE_PROVIDER_SITE_OTHER): Payer: Self-pay

## 2022-08-30 ENCOUNTER — Ambulatory Visit (INDEPENDENT_AMBULATORY_CARE_PROVIDER_SITE_OTHER): Payer: Medicare Other

## 2022-08-30 ENCOUNTER — Telehealth: Payer: Self-pay | Admitting: Podiatry

## 2022-08-30 ENCOUNTER — Ambulatory Visit: Payer: Medicare Other | Admitting: Podiatry

## 2022-08-30 DIAGNOSIS — G5792 Unspecified mononeuropathy of left lower limb: Secondary | ICD-10-CM

## 2022-08-30 DIAGNOSIS — Z0289 Encounter for other administrative examinations: Secondary | ICD-10-CM

## 2022-08-30 DIAGNOSIS — G3184 Mild cognitive impairment, so stated: Secondary | ICD-10-CM

## 2022-08-30 DIAGNOSIS — W57XXXA Bitten or stung by nonvenomous insect and other nonvenomous arthropods, initial encounter: Secondary | ICD-10-CM

## 2022-08-30 DIAGNOSIS — R413 Other amnesia: Secondary | ICD-10-CM

## 2022-08-30 MED ORDER — DOXYCYCLINE HYCLATE 100 MG PO TABS: 100.0000 mg | ORAL_TABLET | Freq: Two times a day (BID) | ORAL | 0 refills | Status: DC

## 2022-08-30 NOTE — Telephone Encounter (Signed)
Per Bennetta Laos Dr Hyatt's nurse pt could come in today. Added to the schedule at 430pm today.

## 2022-08-30 NOTE — Telephone Encounter (Signed)
Pt called the place that you did the procedure on last week 11.1 is still red and tender . I have scheduled him to come in 11.14.2023.

## 2022-08-30 NOTE — Telephone Encounter (Signed)
Patient coming in today at 4:30

## 2022-08-30 NOTE — Telephone Encounter (Signed)
Pts wife called back asking to see a NP today and I explained we do not have any NP today.  Pts wife stated pt was given an injection last week and it is red, swollen and painful and wanted pt to be seen today. Please advise

## 2022-09-01 ENCOUNTER — Ambulatory Visit
Admission: RE | Admit: 2022-09-01 | Discharge: 2022-09-01 | Disposition: A | Discharge status: Home / Self Care | Payer: Medicare Other | Source: Ambulatory Visit | Attending: Neurology | Admitting: Neurology

## 2022-09-01 DIAGNOSIS — E0590 Thyrotoxicosis, unspecified without thyrotoxic crisis or storm: Secondary | ICD-10-CM

## 2022-09-01 DIAGNOSIS — G3184 Mild cognitive impairment, so stated: Secondary | ICD-10-CM

## 2022-09-01 DIAGNOSIS — E210 Primary hyperparathyroidism: Secondary | ICD-10-CM

## 2022-09-01 MED ORDER — GADOPICLENOL 0.5 MMOL/ML IV SOLN
7.5000 mL | Freq: Once | INTRAVENOUS | Status: AC | PRN
  Administered 2022-09-01: 7.5 mL via INTRAVENOUS

## 2022-09-02 NOTE — Progress Notes (Unsigned)
He presents today for follow-up of his hallux left.  States that the toe is more sore now after I gave him the injection last week.  States that the red spot is still present.  Objective: Vital signs are stable alert oriented x3.  Pulses are palpable.  He still has a spot to the plantar medial aspect of the hallux just distal to where I gave him injection last time that appears to have been possibly associated with a foreign body possibly an in and out type foreign bodies about currently do not see anything that the area is exquisitely tender.  He has mild redness extending to the medial aspect of the nail.  Radiographs taken today to confirm that there is no foreign object that is radiopaque and I do not see any on radiograph.  Assessment is possible foreign body cellulitic process.  Plan: At this point we will start him on doxycycline just in case it was a bite or a general infection.  This should help alleviate the signs and symptoms that he is experiencing.  I also discussed soaking Epsom salts and warm water he understands this is amenable to it we will follow-up with me in a week or so to make sure that he is doing better.

## 2022-09-04 ENCOUNTER — Telehealth: Payer: Self-pay | Admitting: Neurology

## 2022-09-04 ENCOUNTER — Ambulatory Visit: Payer: Medicare Other | Admitting: Podiatry

## 2022-09-04 DIAGNOSIS — G3184 Mild cognitive impairment, so stated: Secondary | ICD-10-CM

## 2022-09-04 LAB — LYME DISEASE SEROLOGY W/REFLEX: Lyme Total Antibody EIA: NEGATIVE

## 2022-09-04 LAB — P-TAU181: p-tau181: 1.48 pg/mL — ABNORMAL HIGH (ref 0.00–0.97)

## 2022-09-04 NOTE — Telephone Encounter (Signed)
Mr. Carl Dunn had had questions about the recent blood work that Dr. Brett Fairy ordered.  The pTau181 test came back elevated.  Dr. Brett Fairy is not going to be in the office this week.  I called and got voicemail so I left a message.  I will try to reach him again later today or later in the week

## 2022-09-05 ENCOUNTER — Other Ambulatory Visit (INDEPENDENT_AMBULATORY_CARE_PROVIDER_SITE_OTHER): Payer: Self-pay

## 2022-09-05 DIAGNOSIS — Z0289 Encounter for other administrative examinations: Secondary | ICD-10-CM

## 2022-09-05 DIAGNOSIS — Z9189 Other specified personal risk factors, not elsewhere classified: Secondary | ICD-10-CM

## 2022-09-05 DIAGNOSIS — J0140 Acute pansinusitis, unspecified: Secondary | ICD-10-CM

## 2022-09-05 DIAGNOSIS — G3184 Mild cognitive impairment, so stated: Secondary | ICD-10-CM

## 2022-09-05 DIAGNOSIS — R0683 Snoring: Secondary | ICD-10-CM

## 2022-09-05 NOTE — Telephone Encounter (Signed)
I spoke to Carl Dunn about the pTau181 test.  He already knew it was positive and I went over the significance that that has been shown to correlate with being positive on amyloid PET scans (which in turn has been highly correlated to Alzheimer's disease).  However, there can still be false positives.  I discussed 2 options.  We could order a PET scan though I am not certain if it would be covered by Medicare.  Alternatively we could get the amyloid 42/40 ratio.  An abnormally low ratio has been associated with positivity on amyloid PET and high ratios unless associated with this possibility he would like to proceed with the blood test and I will place the order.    He will follow-up with Dr. Brett Fairy.,

## 2022-09-07 LAB — BETA AMYLOID 42/40 RATIO, PLASMA
Beta-amyloid 40: 236.51 pg/mL
Beta-amyloid 42/40/Ratio: 0.092 — ABNORMAL LOW (ref >0.102)
Beta-amyloid 42: 21.7 pg/mL

## 2022-09-10 ENCOUNTER — Ambulatory Visit: Payer: Medicare Other | Admitting: Neurology

## 2022-09-11 ENCOUNTER — Telehealth: Payer: Self-pay | Admitting: *Deleted

## 2022-09-11 NOTE — Telephone Encounter (Signed)
-----   Message from Larey Seat, MD sent at 09/10/2022 12:59 PM EST ----- Low ratio of Beta amyloid 42/ 40 , which is associated with lower risk of Alzheimer's disease. ( Good result) CD

## 2022-09-12 ENCOUNTER — Other Ambulatory Visit: Payer: Medicare Other

## 2022-09-12 ENCOUNTER — Ambulatory Visit: Payer: Medicare Other | Admitting: Neurology

## 2022-09-12 NOTE — Telephone Encounter (Addendum)
Hello Mr. Bitterman,  I misinterpreted the result. It says the ratio is associated with a higher likelihood of Alzheimer's disease , not the opposite.  I am very sorry to  have caused this confusion.   "Plasma beta-amyloid 1-42/1-40 ratios less than or equal to 0.102  suggest a higher probability of a patient being clinically  diagnosed with Alzheimer's Disease (AD), while values above 0.102  suggest a lower probability of AD diagnosis."   The elevated P TAU level is also a marker of Alzheimer's  related amyloid deposits, and both test are pointing to AD ( Alzheimers disease) developing. We are hoping to offer the newly FDA approved infusion therapies soon in Summer 2024 for patients like you, who have only mild impairment or insignificant impairment in cognitive function and can start treatment to prevent or delay the evolution into Dementia.   Larey Seat, MD

## 2022-09-18 ENCOUNTER — Ambulatory Visit (INDEPENDENT_AMBULATORY_CARE_PROVIDER_SITE_OTHER): Payer: Medicare Other | Admitting: Neurology

## 2022-09-18 DIAGNOSIS — G4733 Obstructive sleep apnea (adult) (pediatric): Secondary | ICD-10-CM | POA: Diagnosis not present

## 2022-09-18 DIAGNOSIS — G3184 Mild cognitive impairment, so stated: Secondary | ICD-10-CM

## 2022-09-18 DIAGNOSIS — R0683 Snoring: Secondary | ICD-10-CM

## 2022-09-18 DIAGNOSIS — J0140 Acute pansinusitis, unspecified: Secondary | ICD-10-CM

## 2022-09-18 DIAGNOSIS — Z9189 Other specified personal risk factors, not elsewhere classified: Secondary | ICD-10-CM

## 2022-09-20 NOTE — Progress Notes (Signed)
See procedure note.

## 2022-09-23 NOTE — Procedures (Signed)
   [facility]  HOME SLEEP TEST (Watch PAT) REPORT  STUDY DATE: 09/18/22  DOB: 17-Jul-1951  MRN: [id number]  ORDERING CLINICIAN: Star Age, MD, PhD   REFERRING CLINICIAN: [name], PA (PCP), Dr. Krista Blue (Neuro), Dr. Brett Fairy (Sleep)  CLINICAL INFORMATION/HISTORY: 71 yo male with a history of memory loss, HLP, tinnitus, hearing loss, who reports snoring and sleep disruption.  Epworth sleepiness score: 0/24.  BMI: 24.6 kg/m  FINDINGS:   Sleep Summary:   Total Recording Time (hours, min): 10 hours, 27 min  Total Sleep Time (hours, min):  9 hours, 42 min  Percent REM (%):    28.3%   Respiratory Indices:   Calculated pAHI (per hour):  18.9/hour         REM pAHI:    18.7/hour       NREM pAHI: 18.9/hour  Central pAHI: 0.5/hour  Oxygen Saturation Statistics:    Oxygen Saturation (%) Mean: 95%   Minimum oxygen saturation (%):                 79%   O2 Saturation Range (%): 79 - 99%    O2 Saturation (minutes) <=88%: 6.3 min  Pulse Rate Statistics:   Pulse Mean (bpm):    76/min    Pulse Range (48 - 120/min)   IMPRESSION: OSA (obstructive sleep apnea)   RECOMMENDATION:  This home sleep test demonstrates moderate obstructive sleep apnea with a total AHI of 18.9/hour and O2 nadir of 79%. Intermittent, mild to moderate snoring was detected, at times, in the louder range. Treatment with a positive airway pressure (PAP) device is recommended. The patient will be advised to proceed with an autoPAP titration/trial at home for now. A full night titration study may be considered to optimize treatment settings, monitor proper oxygen saturations and aid with improvement of tolerance and adherence, if needed down the road. Alternative treatment options may include a dental device through dentistry or orthodontics in selected patients or Inspire (hypoglossal nerve stimulator) in carefully selected patients (meeting inclusion criteria).  Concomitant weight loss  is recommended (where clinically appropriate). Please note that untreated obstructive sleep apnea may carry additional perioperative morbidity. Patients with significant obstructive sleep apnea should receive perioperative PAP therapy and the surgeons and particularly the anesthesiologist should be informed of the diagnosis and the severity of the sleep disordered breathing. The patient should be cautioned not to drive, work at heights, or operate dangerous or heavy equipment when tired or sleepy. Review and reiteration of good sleep hygiene measures should be pursued with any patient. Other causes of the patient's symptoms, including circadian rhythm disturbances, an underlying mood disorder, medication effect and/or an underlying medical problem cannot be ruled out based on this test. Clinical correlation is recommended.  The patient and his referring provider will be notified of the test results. The patient will be seen in follow up in sleep clinic at [facility].  I certify that I have reviewed the raw data recording prior to the issuance of this report in accordance with the standards of the American Academy of Sleep Medicine (AASM).  INTERPRETING PHYSICIAN:   Star Age, MD, PhD Medical Director, [place] [facility] at [facility]) [place], ABPN (Neurology and Sleep)   [facility] [address] [place], [place] [address] [phone]

## 2022-09-24 ENCOUNTER — Telehealth: Payer: Self-pay | Admitting: *Deleted

## 2022-09-24 ENCOUNTER — Other Ambulatory Visit: Payer: Self-pay | Admitting: Neurology

## 2022-09-24 DIAGNOSIS — G4733 Obstructive sleep apnea (adult) (pediatric): Secondary | ICD-10-CM

## 2022-09-24 NOTE — Telephone Encounter (Signed)
Please arrange for FU with NP, also ok to do VV.

## 2022-09-24 NOTE — Telephone Encounter (Signed)
-----   Message from Star Age, MD sent at 09/24/2022 10:37 AM EST ----- This patient saw Dr. Brett Fairy for sleep evaluation on 08/23/2022.  I read the HST on Dr. Edwena Felty behalf:   Please call and notify the patient that the recent home sleep test showed obstructive sleep apnea in the moderate range. I recommend treatment in the form of autoPAP, which means, that we don't have to bring him in for a sleep study with CPAP, but will let him start using a so called autoPAP machine at home, which is a CPAP-like machine with self-adjusting pressures. We will send the order to a local DME company (of his choice, or as per insurance requirement). The DME representative will fit him with a mask, educate him on how to use the machine, how to put the mask on, etc. I have placed an order in the chart. Please send the order, talk to patient, send report to referring MD. We will need a FU in sleep clinic for 10 weeks post-PAP set up, please arrange that with Dr. Keturah Barre. or one of our NPs. Also reinforce the need for compliance with treatment. Thanks,   Star Age, MD, PhD [facility])

## 2022-09-24 NOTE — Telephone Encounter (Signed)
Thank you for placing order.   Pt asking for a call from you to talk more in detail about sleep study results. Does not want to wait for appt with Dr. Brett Fairy. Was very addiment about this. Wants to discuss graphs, how to read, etc.

## 2022-09-24 NOTE — Telephone Encounter (Addendum)
Called and spoke with pt/wife about sleep study results. He verbalized understanding. He would like to get set up with CPAP machine. Aware order will be sent to [facility] and they will call in the next couple weeks. I sent community message to them that order placed. Scheduled initial cpap f/u for 12/24/22 at 2:30pm with Dr. Brett Fairy. He is aware this f/u needs to be 31-90 days after starting on machine.   He also wanted separate appt scheduled with Dr. Brett Fairy sooner to discuss lab results/treatment plan. This was scheduled for 12/20/22 at 9:30pm. He was added to wait list. He is aware Dr. Brett Fairy out on leave until end of January and this was soonest available. He is hoping to get in sooner once she's back.  He is asking for a call from Dr. Rexene Alberts to go over sleep study results in detail (graph, etc...). I advised Dr. Brett Fairy would go over this in detail at appointment but he was addiment about not wanting to wait. Aware I will send to Dr. Rexene Alberts to see if she is willing to discuss further with him.

## 2022-09-24 NOTE — Telephone Encounter (Signed)
Dr. Rexene Alberts, would it be ok to schedule with NP? He wants MD to breakdown sleep study report, what graphs mean, how to read. Just want to make sure that would be ok still.

## 2022-09-25 ENCOUNTER — Ambulatory Visit: Payer: Medicare Other | Admitting: Physical Therapy

## 2022-09-27 ENCOUNTER — Ambulatory Visit: Payer: Medicare Other | Admitting: Physical Therapy

## 2022-09-27 ENCOUNTER — Encounter: Payer: Medicare Other | Admitting: Physical Therapy

## 2022-10-01 ENCOUNTER — Encounter: Payer: Medicare Other | Admitting: Physical Therapy

## 2022-10-03 ENCOUNTER — Encounter: Payer: Medicare Other | Admitting: Physical Therapy

## 2022-10-08 ENCOUNTER — Encounter: Payer: Medicare Other | Admitting: Physical Therapy

## 2022-10-11 ENCOUNTER — Telehealth: Payer: Self-pay | Admitting: Neurology

## 2022-10-11 ENCOUNTER — Encounter: Payer: Medicare Other | Admitting: Physical Therapy

## 2022-10-11 ENCOUNTER — Encounter: Payer: Self-pay | Admitting: Neurology

## 2022-10-11 NOTE — Telephone Encounter (Signed)
Pt wife is calling. Stated she needs for you to fax CPAP prescription to [email or web address] at [phone]. She is asking for a call back to confirm the fax was sent.

## 2022-10-11 NOTE — Telephone Encounter (Signed)
Pt also sent mychart. Waiting on response.

## 2022-10-18 ENCOUNTER — Encounter: Payer: Medicare Other | Admitting: Physical Therapy

## 2022-11-06 ENCOUNTER — Telehealth: Payer: Self-pay | Admitting: Neurology

## 2022-11-06 NOTE — Telephone Encounter (Signed)
LVM and sent mychart msg informing pt about r/s needed for 1/23 appt- MD out.

## 2022-11-13 ENCOUNTER — Ambulatory Visit: Payer: Medicare Other | Admitting: Neurology

## 2022-12-17 ENCOUNTER — Ambulatory Visit: Payer: Medicare Other | Admitting: Neurology

## 2022-12-17 ENCOUNTER — Encounter: Payer: Self-pay | Admitting: Neurology

## 2022-12-17 VITALS — BP 113/75 | HR 69 | Ht 70.0 in | Wt 170.6 lb

## 2022-12-17 DIAGNOSIS — G3184 Mild cognitive impairment, so stated: Secondary | ICD-10-CM

## 2022-12-17 DIAGNOSIS — G4733 Obstructive sleep apnea (adult) (pediatric): Secondary | ICD-10-CM | POA: Diagnosis not present

## 2022-12-17 DIAGNOSIS — R001 Bradycardia, unspecified: Secondary | ICD-10-CM | POA: Diagnosis not present

## 2022-12-17 MED ORDER — DONEPEZIL HCL 5 MG PO TABS: 5.0000 mg | ORAL_TABLET | Freq: Every day | ORAL | 5 refills | Status: DC

## 2022-12-17 MED ORDER — DONEPEZIL HCL 5 MG PO TABS: 5.0000 mg | ORAL_TABLET | Freq: Every day | ORAL | 0 refills | Status: DC

## 2022-12-17 NOTE — Addendum Note (Signed)
Addended by: Larey Seat on: 12/17/2022 09:55 AM   Modules accepted: Orders

## 2022-12-17 NOTE — Patient Instructions (Signed)
Donepezil Tablets- ARICEPT    We start at 5 mg daily, you can choose to take this medication in AM or before bedtime.   What is this medication? DONEPEZIL (doe NEP e zil) treats memory loss and confusion (dementia) in people who have Alzheimer disease. It works by improving attention, memory, and the ability to engage in daily activities. It is not a cure for dementia or Alzheimer disease. This medicine may be used for other purposes; ask your health care provider or pharmacist if you have questions. COMMON BRAND NAME(S): Aricept What should I tell my care team before I take this medication? They need to know if you have any of these conditions: Asthma or other lung disease Difficulty passing urine Head injury Heart disease History of irregular heartbeat Liver disease Seizures (convulsions) Stomach or intestinal disease, ulcers or stomach bleeding An unusual or allergic reaction to donepezil, other medications, foods, dyes, or preservatives Pregnant or trying to get pregnant Breast-feeding How should I use this medication? Take this medication by mouth with a glass of water. Follow the directions on the prescription label. You may take this medication with or without food. Take this medication at regular intervals. This medication is usually taken before bedtime. Do not take it more often than directed. Continue to take your medication even if you feel better. Do not stop taking except on your care team's advice. If you are taking the 23 mg donepezil tablet, swallow it whole; do not cut, crush, or chew it. Talk to your care team about the use of this medication in children. Special care may be needed. Overdosage: If you think you have taken too much of this medicine contact a poison control center or emergency room at once. NOTE: This medicine is only for you. Do not share this medicine with others. What if I miss a dose? If you miss a dose, take it as soon as you can. If it is almost  time for your next dose, take only that dose, do not take double or extra doses. What may interact with this medication? Do not take this medication with any of the following: Certain medications for fungal infections like itraconazole, fluconazole, posaconazole, and voriconazole Cisapride Dextromethorphan; quinidine Dronedarone Pimozide Quinidine Thioridazine This medication may also interact with the following: Antihistamines for allergy, cough and cold Atropine Bethanechol Carbamazepine Certain medications for bladder problems like oxybutynin, tolterodine Certain medications for Parkinson's disease like benztropine, trihexyphenidyl Certain medications for stomach problems like dicyclomine, hyoscyamine Certain medications for travel sickness like scopolamine Dexamethasone Dofetilide Ipratropium NSAIDs, medications for pain and inflammation, like ibuprofen or naproxen Other medications for Alzheimer's disease Other medications that prolong the QT interval (cause an abnormal heart rhythm) Phenobarbital Phenytoin Rifampin, rifabutin or rifapentine Ziprasidone This list may not describe all possible interactions. Give your health care provider a list of all the medicines, herbs, non-prescription drugs, or dietary supplements you use. Also tell them if you smoke, drink alcohol, or use illegal drugs. Some items may interact with your medicine. What should I watch for while using this medication? Visit your care team for regular checks on your progress. Check with your care team if your symptoms do not get better or if they get worse. You may get drowsy or dizzy. Do not drive, use machinery, or do anything that needs mental alertness until you know how this medication affects you. What side effects may I notice from receiving this medication? Side effects that you should report to your care team as soon as  possible: Allergic reactions--skin rash, itching, hives, swelling of the face,  lips, tongue, or throat Peptic ulcer--burning stomach pain, loss of appetite, bloating, burping, heartburn, nausea, vomiting Seizures Slow heartbeat--dizziness, feeling faint or lightheaded, confusion, trouble breathing, unusual weakness or fatigue Stomach bleeding--bloody or black, tar-like stools, vomiting blood or brown material that looks like coffee grounds Trouble passing urine Side effects that usually do not require medical attention (report these to your care team if they continue or are bothersome): Diarrhea Fatigue Loss of appetite Muscle pain or cramps Nausea Trouble sleeping This list may not describe all possible side effects. Call your doctor for medical advice about side effects. You may report side effects to FDA at 1-800-FDA-1088. Where should I keep my medication? Keep out of reach of children. Store at room temperature between 15 and 30 degrees C (59 and 86 degrees F). Throw away any unused medication after the expiration date. NOTE: This sheet is a summary. It may not cover all possible information. If you have questions about this medicine, talk to your doctor, pharmacist, or health care provider.  2023 Elsevier/Gold Standard (2021-04-27 00:00:00)

## 2022-12-17 NOTE — Progress Notes (Addendum)
SLEEP MEDICINE CLINIC    Provider:  Larey Seat, MD  Primary Care Physician:  Pcp, No No address on file     Referring Provider:   [name], PA, in  [facility].        Chief Complaint according to patient   Patient presents with:     New Patient (Initial Visit)           HISTORY OF PRESENT ILLNESS: Patient room #2 with his wife. Pt here today to f/u with his OSA.   12-17-2022:  Carl Dunn is a 72 y.o. Caucasian male patient seen here in  REVISIT  on 12/17/2022 , After our initial visit I had ordered a home sleep test for the patient who had presented with mild cognitive impairment, snoring, history of subacute pansinusitis and been identified is at risk for sleep apnea based on his wife's reporting of snoring and sleep disruption.  A home sleep test was done by [facility] on 28 November 23. The total recording time was over 10 hours with a sleep time of 9 hours 42 minutes.  The AHI was 19/h rounded up there were few central events it was not REM sleep dependent apnea and he did have mild oxygen desaturation.  Intermittent bradycardia was noted.  Snoring was at sometimes in the louder range.  So this was mild to moderate sleep apnea with intermittent loud snoring.  He started CPAP and has been neither sleepy nor fatigued- after CPAP-he is feeling  this great, a major improvement. He sleeps average of 8 hours, deep and restorative.  He feels " normal" . His wife reports him to retain  information and have more energy , he wakes up refreshed.  He has gotten hearing aids, and his tinnitus is improved. Memory is subjectively back to normal.     Originally referred from [facility] for a new evaluation of memory loss, but has been Dr Greer Pickerel patient 2022.  Chief concern according to patient :  " Memory concerns , had seen Dr. Krista Blue 2022, now referred  by [facility] for sleep study, MRI brain and re- testing of his memory as well. "  He was send to me  even that he has a primary  neurologist at [facility].     [name]  has a past medical history of Anaphylaxis, MCI  and Hyperlipidemia. Short term memory loss.     Sleep relevant medical history: Insomnia,  parathyroid surgery, tinnitus, hearing loss, wisdom teeth extraction in his 9s.    Family medical /sleep history: both parents died f cancer before age 61, CAD in paternal side, PGM passed of a brain cancer. MGF lived until 56 , COPD, MGM died in 1 of suicide , no other family member on CPAP with OSA, insomnia, no sleep walkers.    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  ETOH use : seldomly,  Caffeine intake in form of Coffee( daily in AM ) Soda( /) Tea ( green tea one a day - ) or energy drinks. Regular exercise in form of - foresting, yard work.        Sleep habits are as follows: The patient's dinner time is between 3-5  PM. Dr Lelon Mast diet, The patient goes to bed at 9-10 PM and  no trouble falling asleep- continues to sleep for 6-7 hours, wakes for 0-1 bathroom break. He may wake up at 2-3 AM and will read -  The preferred sleep position is supine , with the support of 1 pillow in a flat bed.  Dreams are reportedly rare.   6 AM is the usual rise time. The patient wakes up spontaneously between 5-6AM He reports feeling refreshed or restored in AM, with symptoms such as dry mouth, and residual fatigue.  Naps are taken infrequently, not scheduled, lasting from 30 to 45 minutes -refreshing than nocturnal sleep.    Review of Systems: Out of a complete 14 system review, the patient complains of only the following symptoms, and all other reviewed systems are negative.:  Fatigue, sleepiness , snoring, fragmented sleep, rare Insomnia    How likely are you to doze in the following situations: 0 = not likely, 1 = slight chance, 2 =  moderate chance, 3 = high chance   Sitting and Reading? Watching Television? Sitting inactive in a public place (theater or meeting)? As a passenger in a car for an hour without a break? Lying down in the afternoon when circumstances permit? Sitting and talking to someone? Sitting quietly after lunch without alcohol? In a car, while stopped for a few minutes in traffic?   Total = 0/ 24 points   FSS endorsed at  9 down from 15/ 63 points.   Pressured speech, tangential, logorrhea.   Social History   Socioeconomic History   Marital status: Married    Spouse name: Not on file   Number of children: 0   Years of education: Not on file   Highest education level: Not on file  Occupational History   Occupation: Retired former Publishing copy  Tobacco Use   Smoking status: Never   Smokeless tobacco: Never  Substance and Sexual Activity   Alcohol use: No   Drug use: No   Sexual activity: Not on file  Other Topics Concern   Not on file  Social History Narrative   Not on file   Social Determinants of Health   Financial Resource Strain: Not on file  Food Insecurity: Not on file  Transportation Needs: Not on file  Physical Activity: Not on file  Stress: Not on file  Social Connections: Not on file    Family History  Problem Relation Age of Onset   Hypoparathyroidism Mother        patient   Cancer Mother    Hyperlipidemia Father    Colon cancer Father    Hypertension Father    Cancer Father    Heart attack Paternal Grandfather     Past Medical History:  Diagnosis Date   Anaphylaxis    Hyperlipidemia     Past Surgical History:  Procedure Laterality Date   THYROID SURGERY       Current Outpatient Medications on File Prior to Visit  Medication Sig Dispense Refill   Cholecalciferol (VITAMIN D PO) Take 1 tablet by mouth daily.     doxycycline (VIBRA-TABS) 100 MG tablet Take 1 tablet (100 mg total) by mouth 2 (two) times daily. 20 tablet 0   EPINEPHrine (EPIPEN)  0.3 mg/0.3 mL DEVI Inject 0.3 mLs (0.3 mg total) into the muscle as needed (anaphylaxis). 2 Device 1   Iodine, Kelp, (KELP PO) Take by mouth.     MAGNESIUM PO Take 1 tablet by mouth daily.     Multiple Vitamins-Minerals (ZINC PO) Take 1 tablet by mouth daily.  atorvastatin (LIPITOR) 20 MG tablet TAKE 1 TABLET BY MOUTH EVERY EVENING. (Patient not taking: Reported on 12/17/2022) 90 tablet 3   No current facility-administered medications on file prior to visit.    Phosphorylated tau: increased.   Amyloid :  42 to 40 ratio: 0.092 , lower  than normal.   MRI _ IMPRESSION: This MRI of the brain with and without contrast shows the following: Brain volume was normal for age Couple T2/FLAIR hyperintense foci consistent with very minimal chronic microvascular ischemic change, normal for age. No acute findings.  Normal enhancement pattern.       INTERPRETING PHYSICIAN:  Richard A. Felecia Shelling, MD, PhD, FAAN  Physical exam:  Today's Vitals   12/17/22 0904  BP: 113/75  Pulse: 69  Weight: 170 lb 9.6 oz (77.4 kg)  Height: '5\' 10"'$  (1.778 m)   Body mass index is 24.48 kg/m.   Wt Readings from Last 3 Encounters:  12/17/22 170 lb 9.6 oz (77.4 kg)  08/23/22 172 lb 8 oz (78.2 kg)  06/28/22 174 lb 3.2 oz (79 kg)     Ht Readings from Last 3 Encounters:  12/17/22 '5\' 10"'$  (1.778 m)  08/23/22 '5\' 10"'$  (1.778 m)  06/28/22 '5\' 10"'$  (1.778 m)      General: The patient is awake, alert and appears not in acute distress. The patient is well groomed. Head: Normocephalic, atraumatic.  Neck is supple. Mallampati 3,  neck circumference:16 inches . Nasal airflow patent.   Retrognathia is seen.  Dental status: crowded, crossbite, retrognathia- small mouth . Cardiovascular:  Regular rate and cardiac rhythm by pulse,  without distended neck veins. Respiratory: Lungs are clear to auscultation.  Skin:  Without evidence of ankle edema, or rash. Trunk: The patient's posture is erect.   Neurologic exam : The  patient is awake and alert, oriented to place and time.   Memory subjective described as impaired- but his perception is different form his wife's-  Short term memory is most affected.      12/17/2022    9:49 AM 08/23/2022    1:49 PM 08/03/2021    2:03 PM  Montreal Cognitive Assessment   Visuospatial/ Executive (0/5) '4 3 4  '$ Naming (0/3) '3 3 3  '$ Attention: Read list of digits (0/2) '2 2 2  '$ Attention: Read list of letters (0/1) 1 0 1  Attention: Serial 7 subtraction starting at 100 (0/3) '3 2 3  '$ Language: Repeat phrase (0/2) '2 2 2  '$ Language : Fluency (0/1) '1 1 1  '$ Abstraction (0/2) '2 2 2  '$ Delayed Recall (0/5) '1 2 1  '$ Orientation (0/6) '6 6 6  '$ Total '25 23 25  '$ Adjusted Score (based on education)   25     Attention span & concentration ability appears very limited, distractible, tangential and and veering off target.  Speech is fluent,  without  dysarthria, with mild  dysphonia , no aphasia.  Mood and affect are pressured, talking without stop.    Cranial nerves: no loss of smell or taste reported  Pupils are equal and briskly reactive to light. Funduscopic exam deferred.  Extraocular movements in vertical and horizontal planes were intact and without nystagmus. No Diplopia. Visual fields by finger perimetry are intact. Hearing was impaired  to soft voice and finger rubbing. He used a hearing aid today-    Facial sensation intact to fine touch.  Facial motor strength is symmetric and tongue and uvula move in midline.  Neck ROM : rotation, tilt and flexion extension were normal for  age and shoulder shrug was symmetrical.    Motor exam:  Symmetric bulk, tone and ROM.   Normal tone without cog- wheeling, symmetric grip strength .   Sensory:  Fine touch, pinprick and vibration were normal.  Proprioception tested in the upper extremities was normal.   Coordination: Rapid alternating movements in the fingers/hands were of normal speed.  The Finger-to-nose maneuver was intact without evidence  of ataxia, dysmetria or tremor.   Gait and station: Patient could rise unassisted from a seated position, walked without assistive device.  Stance is of normal width/ base and the patient turned with 3 steps.  Toe and heel walk were deferred.  Deep tendon reflexes: in the  upper and lower extremities are symmetric and intact.  Babinski response was deferred .       After spending a total time of  33  minutes face to face and additional time for physical and neurologic examination, review of laboratory studies,  personal review of imaging studies, reports and results of other testing and review of referral information / records as far as provided in visit, I have established the following  assessment and plan:  He started CPAP and has been neither sleepy nor fatigued- after CPAP-he is feeling  this great, a major improvement. He sleeps average of 8 hours, deep and restorative.  He feels " normal" . His wife reports him to retain  information and have more energy , he wakes up refreshed.  He has gotten hearing aids, and his tinnitus is improved. Memory is subjectively back to normal He has not needed a phlebotomy- for his hemochromatosis, and his last appointment was cancelled after he started CPAP- this may have improved his oxygen supply and thereby reduced the H and H and RBC.   1)  short term memory loss has improved with sleep restoration on CPAP.  Alzheimer risk factors:  The amyloid ration was abnormal, p tau was increased, MRI normal.  MOCA in 6 months again.  I like to repeat a MOCA in 6 months. Today moca was one by RN Mardene Celeste-  2)  ADHD - impulsive, tangential, he should be tested by neuropsychology .  3)  OSA on CPAP. Will follow up with NP in 4 weeks ? That will be his first compliance visit.  4) Hereditary hemochromatosis. IMPROVED on CPAP - this may be related to treated HYPOXIA of sleep.   I would like to thank Dr Krista Blue and [place]  at [facility] in internal medicine for  allowing me to meet with and to take care of this pleasant patient's sleep concerns. .   In short, Carl Dunn is presenting with short term memory loss, will follow up  through our NP within 6 months.  We will discuss amyloid scan MRI in that visit and may repeat P TAU serum factor.  No  after March 2024 sleep follow up to keep  sleep and memory follow up separately,  the memory and sleep disorder can be addressed in one RV of 30 minutes in 6 months again.   CC: I will share my notes with PCP .  Electronically signed by: Larey Seat, MD 12/17/2022 9:19 AM  [facility] Board certified by The [employer] of Sleep Medicine and Diplomate of the [employer] of Sleep Medicine. Board certified In Neurology through the [place], Fellow of the [employer] of Neurology. Medical Director of [facility].

## 2022-12-20 ENCOUNTER — Ambulatory Visit: Payer: Medicare Other | Admitting: Neurology

## 2022-12-20 ENCOUNTER — Encounter: Payer: Self-pay | Admitting: Neurology

## 2022-12-24 ENCOUNTER — Encounter: Payer: Self-pay | Admitting: Neurology

## 2022-12-24 ENCOUNTER — Ambulatory Visit: Payer: Medicare Other | Admitting: Neurology

## 2022-12-24 VITALS — BP 122/72 | HR 89 | Ht 70.0 in | Wt 170.0 lb

## 2022-12-24 DIAGNOSIS — F909 Attention-deficit hyperactivity disorder, unspecified type: Secondary | ICD-10-CM

## 2022-12-24 DIAGNOSIS — G4733 Obstructive sleep apnea (adult) (pediatric): Secondary | ICD-10-CM | POA: Diagnosis not present

## 2022-12-24 DIAGNOSIS — R0683 Snoring: Secondary | ICD-10-CM | POA: Diagnosis not present

## 2022-12-24 NOTE — Progress Notes (Signed)
Provider:  Larey Seat, MD  Primary Care Physician:  Pcp, No No address on file     Referring Provider: No referring provider defined for this encounter.          Chief Complaint according to patient   Patient presents with:     New Patient (Initial Visit)           HISTORY OF PRESENT ILLNESS:  Carl Dunn is a 72 y.o. male patient who is here for revisit 12/24/2022 for initial CPAP follow up.  He got the machine from [email or web address]. and paid out of pocket. Overall doing well. Pt mentions there are a few gaps in the data and that is due to trial and error with different masks and skin irritations that he had .    He has problems with the interface : has mild retrognathia.  For the second half of the months excellent compliance with over 80% of compliance and an average use of 6 hours 55 minutes.  The first half of the months however was sporadic and the patient had explained why.  He is using an AutoSet between 5 and 12 cm water pressure setting without expiratory pressure relief.  His ED his AHI apnea-hypopnea index is 2.8/h which is a very good result his 95th percentile pressure is 11.3 cm water he does have moderate air leakage.  There is an additional problem of mask fitting and air seal because of facial hair. He has bought a chin strap , he wants to use a FFM _ why ??  I suggested a nasal cradle or pillow with a chin strap. Nasal cradle  by [employer] or NI mask form [employer] ,      12-17-2022:   Carl Dunn is a 72 y.o. Caucasian male patient seen here in  REVISIT  on 12/17/2022 , After our initial visit I had ordered a home sleep test for the patient who had presented with mild cognitive impairment, snoring, history of subacute pansinusitis and been identified is at risk for sleep apnea based on his wife's reporting of snoring and sleep disruption.  A home sleep test was done by [facility] on 28 November 23. The total recording time was over 10 hours with a  sleep time of 9 hours 42 minutes.  The AHI was 19/h rounded up there were few central events it was not REM sleep dependent apnea and he did have mild oxygen desaturation.  Intermittent bradycardia was noted.  Snoring was at sometimes in the louder range.  So this was mild to moderate sleep apnea with intermittent loud snoring.   He started CPAP and has been neither sleepy nor fatigued- after CPAP-he is feeling  this great, a major improvement. He sleeps average of 8 hours, deep and restorative.  He feels " normal" . His wife reports him to retain  information and have more energy , he wakes up refreshed.  He has gotten hearing aids, and his tinnitus is improved. Memory is subjectively back to normal.        Originally referred from [facility] for a new evaluation of memory loss, but has been Dr Greer Pickerel patient 2022.  Chief concern according to patient :  " Memory concerns , had seen Dr. Krista Blue 2022, now referred  by [facility] for sleep study, MRI brain and re- testing of his memory as well. "  He was send to me  even that he has a primary  neurologist at [facility].    Carl Dunn has a past medical history of Anaphylaxis, MCI  and Hyperlipidemia. Short term memory loss.     Sleep relevant medical history: Insomnia,  parathyroid surgery, tinnitus, hearing loss, wisdom teeth extraction in his 34s.       Review of Systems: Out of a complete 14 system review, the patient complains of only the following symptoms, and all other reviewed systems are negative.:  Fatigue, sleepiness , snoring, fragmented sleep, = How likely are you to doze in the following situations: 0 = not likely, 1 = slight chance, 2 = moderate chance, 3 = high chance   Sitting and Reading? Watching Television? Sitting inactive in a public place (theater or meeting)? As a passenger in a car for an hour without a break? Lying down in the afternoon when circumstances permit? Sitting and talking to someone? Sitting quietly after lunch  without alcohol? In a car, while stopped for a few minutes in traffic?   Total = 0/ 24 points   FSS endorsed at 9/ 63 points.   Better !!  Social History   Socioeconomic History   Marital status: Married    Spouse name: Not on file   Number of children: 0   Years of education: Not on file   Highest education level: Not on file  Occupational History   Occupation: Retired former Publishing copy  Tobacco Use   Smoking status: Never   Smokeless tobacco: Never  Substance and Sexual Activity   Alcohol use: No   Drug use: No   Sexual activity: Not on file  Other Topics Concern   Not on file  Social History Narrative   Not on file   Social Determinants of Health   Financial Resource Strain: Not on file  Food Insecurity: Not on file  Transportation Needs: Not on file  Physical Activity: Not on file  Stress: Not on file  Social Connections: Not on file    Family History  Problem Relation Age of Onset   Hypoparathyroidism Mother        patient   Cancer Mother    Hyperlipidemia Father    Colon cancer Father    Hypertension Father    Cancer Father    Heart attack Paternal Grandfather     Past Medical History:  Diagnosis Date   Anaphylaxis    Hyperlipidemia     Past Surgical History:  Procedure Laterality Date   THYROID SURGERY       Current Outpatient Medications on File Prior to Visit  Medication Sig Dispense Refill   Cholecalciferol (VITAMIN D PO) Take 1 tablet by mouth daily.     Cyanocobalamin (VITAMIN B 12 PO) Take 1 tablet by mouth as needed.     EPINEPHrine (EPIPEN) 0.3 mg/0.3 mL DEVI Inject 0.3 mLs (0.3 mg total) into the muscle as needed (anaphylaxis). 2 Device 1   Iodine, Kelp, (KELP PO) Take by mouth.     MAGNESIUM PO Take 1 tablet by mouth daily.     Multiple Vitamins-Minerals (ZINC PO) Take 1 tablet by mouth daily.     No current facility-administered medications on file prior to visit.    No Known Allergies   DIAGNOSTIC DATA (LABS, IMAGING,  TESTING) - I reviewed patient records, labs, notes, testing and imaging myself where available.  Lab Results  Component Value Date   WBC 11.7 (H) 08/23/2022   HGB 15.8 08/23/2022   HCT 45.7 08/23/2022   MCV 94 08/23/2022   PLT  331 08/23/2022      Component Value Date/Time   NA 146 (H) 08/23/2022 1519   K 4.7 08/23/2022 1519   CL 103 08/23/2022 1519   CO2 24 08/23/2022 1519   GLUCOSE 94 08/23/2022 1519   BUN 28 (H) 08/23/2022 1519   CREATININE 1.10 08/23/2022 1519   CALCIUM 9.0 08/23/2022 1519   PROT 6.3 08/23/2022 1519   ALBUMIN 4.3 08/23/2022 1519   AST 17 08/23/2022 1519   ALT 20 08/23/2022 1519   ALKPHOS 68 08/23/2022 1519   BILITOT 0.3 08/23/2022 1519   GFRNONAA 62 10/23/2016 0946   GFRAA 72 10/23/2016 0946   Lab Results  Component Value Date   CHOL 232 (H) 06/27/2021   HDL 55.90 06/27/2021   LDLCALC 158 (H) 06/27/2021   TRIG 92.0 06/27/2021   CHOLHDL 4 06/27/2021   Lab Results  Component Value Date   HGBA1C 5.7 (H) 08/23/2022   Lab Results  Component Value Date   [id number] 08/23/2022   Lab Results  Component Value Date   TSH 0.792 08/23/2022    PHYSICAL EXAM:  Today's Vitals   12/24/22 1428  BP: 122/72  Pulse: 89  Weight: 170 lb (77.1 kg)  Height: '5\' 10"'$  (1.778 m)   Body mass index is 24.39 kg/m.   Wt Readings from Last 3 Encounters:  12/24/22 170 lb (77.1 kg)  12/17/22 170 lb 9.6 oz (77.4 kg)  08/23/22 172 lb 8 oz (78.2 kg)     Ht Readings from Last 3 Encounters:  12/24/22 '5\' 10"'$  (1.778 m)  12/17/22 '5\' 10"'$  (1.778 m)  08/23/22 '5\' 10"'$  (1.778 m)      General: The patient is awake, alert and appears not in acute distress. The patient is well groomed. Head: Normocephalic, atraumatic.  Neck is supple. Mallampati 3,  neck circumference:16 inches . Nasal airflow patent.   Retrognathia is seen.  Dental status: crowded, crossbite, retrognathia- small mouth . Cardiovascular:  Regular rate and cardiac rhythm by pulse,  without distended  neck veins. Respiratory: Lungs are clear to auscultation.  Skin:  Without evidence of ankle edema, or rash. Trunk: The patient's posture is erect.   Neurologic exam : The patient is awake and alert, oriented to place and time.   Memory subjective described as impaired- but his perception is different form his wife's-  Short term memory is most affected.        12/17/2022    9:49 AM 08/23/2022    1:49 PM 08/03/2021    2:03 PM  Montreal Cognitive Assessment   Visuospatial/ Executive (0/5) '4 3 4  '$ Naming (0/3) '3 3 3  '$ Attention: Read list of digits (0/2) '2 2 2  '$ Attention: Read list of letters (0/1) 1 0 1  Attention: Serial 7 subtraction starting at 100 (0/3) '3 2 3  '$ Language: Repeat phrase (0/2) '2 2 2  '$ Language : Fluency (0/1) '1 1 1  '$ Abstraction (0/2) '2 2 2  '$ Delayed Recall (0/5) '1 2 1  '$ Orientation (0/6) '6 6 6  '$ Total '25 23 25  '$ Adjusted Score (based on education)     25     Attention span & concentration ability appears very limited, distractible, tangential and and veering off target.  Speech is fluent,  without  dysarthria, with mild  dysphonia , no aphasia.  Mood and affect are pressured, talking without stop.    Cranial nerves: no loss of smell or taste reported  Pupils are equal and briskly reactive to light. Funduscopic exam deferred.  Extraocular movements in vertical and horizontal planes were intact and without nystagmus. No Diplopia. Visual fields by finger perimetry are intact. Hearing was impaired  to soft voice and finger rubbing. He used a hearing aid today-    Facial sensation intact to fine touch.  Facial motor strength is symmetric and tongue and uvula move in midline.  Neck ROM : rotation, tilt and flexion extension were normal for age and shoulder shrug was symmetrical.    Motor exam:  Symmetric bulk, tone and ROM.   Normal tone without cog- wheeling, symmetric grip strength .   Sensory:  Fine touch, pinprick and vibration were normal.  Proprioception tested in  the upper extremities was normal.   Coordination: Rapid alternating movements in the fingers/hands were of normal speed.  The Finger-to-nose maneuver was intact without evidence of ataxia, dysmetria or tremor.   Gait and station: Patient could rise unassisted from a seated position, walked without assistive device.  Stance is of normal width/ base and the patient turned with 3 steps.  Toe and heel walk were deferred.  Deep tendon reflexes: in the  upper and lower extremities are symmetric and intact.  Babinski response was deferred .      ASSESSMENT AND PLAN 72 y.o. year old male  here with:    1) follow up on new CPAP, bought outright, needs to change to nasal cradle or nasal pillow with chin strap.  His facial hair does not allow good air seal.  I introduced him to a nasal cradle mask, [employer] or [employer] dreamware.  2) CPAP compliance is good since the last 3 weeks.  3) NO time dedicated to memory today.   Rv in 7 months with NP - memory retesting and follow up CPAP yearly .   I plan to follow up either personally or through our NP within 6-8 months.   After spending a total time of  22  minutes face to face and additional time for physical and neurologic examination, review of laboratory studies,  personal review of imaging studies, reports and results of other testing and review of referral information / records as far as provided in visit,   Electronically signed by: Larey Seat, MD 12/24/2022 3:20 PM  [facility] [place] certified by The [employer] of Sleep Medicine and Diplomate of the [employer] of Sleep Medicine. Board certified In Neurology through the [place], Fellow of the [employer] of Neurology. Medical Director of [employer].

## 2022-12-24 NOTE — Patient Instructions (Signed)
ASSESSMENT AND PLAN 72 y.o. year old male  here with:    1) follow up on new CPAP, bought outright, needs to change to nasal cradle or nasal pillow with chin strap.  His facial hair does not allow good air seal.  I introduced him to a nasal cradle mask, [employer] or [employer] dreamware.  2) CPAP compliance is good since the last 3 weeks.  3) NO time dedicated to memory today.   Rv in 7 months with NP - memory retesting and follow up CPAP yearly .   I plan to follow up either personally or through our NP within 6-8 months.   After spending a total time of  22  minutes face to face and additional time for physical and neurologic examination, review of laboratory studies,  personal review of imaging studies, reports and results of other testing and review of referral information / records as far as provided in visit,   Electronically signed by: Larey Seat, MD 12/24/2022 3:20 PM

## 2023-01-16 ENCOUNTER — Ambulatory Visit (INDEPENDENT_AMBULATORY_CARE_PROVIDER_SITE_OTHER): Payer: Medicare Other | Admitting: Family Medicine

## 2023-01-16 ENCOUNTER — Encounter: Payer: Self-pay | Admitting: Family Medicine

## 2023-01-16 VITALS — BP 102/62 | HR 87 | Ht 70.0 in | Wt 169.0 lb

## 2023-01-16 DIAGNOSIS — G3184 Mild cognitive impairment, so stated: Secondary | ICD-10-CM

## 2023-01-16 DIAGNOSIS — E785 Hyperlipidemia, unspecified: Secondary | ICD-10-CM

## 2023-01-16 DIAGNOSIS — Z7689 Persons encountering health services in other specified circumstances: Secondary | ICD-10-CM

## 2023-01-16 DIAGNOSIS — E213 Hyperparathyroidism, unspecified: Secondary | ICD-10-CM

## 2023-01-16 NOTE — Patient Instructions (Signed)
It was nice to meet you today,   You can come back next week for your lab testing.  I will let you know the results when I get them.    You can follow back up in 2-3 months at your convenience.    If you need to see me sooner, you can always call to schedule an acute visit.    Have a great day,    Dr. Jeannine Kitten

## 2023-01-16 NOTE — Progress Notes (Signed)
New Patient Office Visit  Subjective    Patient ID: Carl Dunn, male    DOB: [birth year]  Age: 72 y.o. MRN: [id number]  CC:  Chief Complaint  Patient presents with   Establish Care    HPI [place] presents to establish care.  He has not had a primary care physician since coming to [place] but does see multiple specialists for various issues.  Patient is currently retired.  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  Has a bachelor's, masters and [employer].  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  He lives a very active lifestyle.  Lifts weights, does cardio in addition to the activity he gets from working in his garden.  Is very careful about his diet.  Currently uses principles from a book called the plant paradox by a Dr. Ardeth Perfect to guide his dietary choices.  Patient's medical history includes hemochromatosis, hyperparathyroidism, obstructive sleep apnea.  Sought out a neurologist when he began feeling like he was losing his cognitive edge, which is very important to him given his career.  This was where he was diagnosed with obstructive sleep apnea and states that this improved the vast majority of his symptoms.  Prior to that he was diagnosed with hyperparathyroidism and noted some improvement after surgical removal of the abnormal parathyroid glands.  Patient states that he previously underwent blood donations to treat his hemochromatosis but since changing his diet he has not had to do that.  PMH: osa, hemochromatosis, parathyroidectomy.    PSH: parathyroidectomy  FH: father - colon cancer.  Mother -hepatocarcinoma  Tobacco use: Never Alcohol use: No Drug use: No Marital status: married.  Wife michele.   Employment: retired Nurse, learning disability.  Sexual hx: sexually active with wife.  Declines STI  screening    Outpatient Encounter Medications as of 01/16/2023  Medication Sig   Cholecalciferol (VITAMIN D PO) Take 1 tablet by mouth daily.   Cyanocobalamin (VITAMIN B 12 PO) Take 1 tablet by mouth as needed.   MAGNESIUM PO Take 1 tablet by mouth daily.   Multiple Vitamins-Minerals (ZINC PO) Take 1 tablet by mouth daily.   EPINEPHrine (EPIPEN) 0.3 mg/0.3 mL DEVI Inject 0.3 mLs (0.3 mg total) into the muscle as needed (anaphylaxis).   Iodine, Kelp, (KELP PO) Take by mouth.   No facility-administered encounter medications on file as of 01/16/2023.    Past Medical History:  Diagnosis Date   Anaphylaxis    Hyperlipidemia    Melanoma ([place])    Nephrolithiasis 06/28/2021    Past Surgical History:  Procedure Laterality Date   MELANOMA EXCISION     THYROID SURGERY      Family History  Problem Relation Age of Onset   Hypoparathyroidism Mother        patient   Cancer Mother    Hyperlipidemia Father    Colon cancer Father    Hypertension Father    Cancer Father    Heart attack Paternal Grandfather     Social History   Socioeconomic History   Marital status: Married    Spouse name: Not on file   Number of children: 0   Years of education: Not on file   Highest education level: Master's degree (e.g., MA, MS, MEng, MEd, MSW, MBA)  Occupational History   Occupation: Retired former Publishing copy  Tobacco Use  Smoking status: Never   Smokeless tobacco: Never  Substance and Sexual Activity   Alcohol use: No   Drug use: No   Sexual activity: Yes    Birth control/protection: None  Other Topics Concern   Not on file  Social History Narrative   Not on file   Social Determinants of Health   Financial Resource Strain: Not on file  Food Insecurity: Not on file  Transportation Needs: Not on file  Physical Activity: Not on file  Stress: Not on file  Social Connections: Not on file  Intimate Partner Violence: Not on file    ROS      Objective    BP 102/62    Pulse 87   Ht 5\' 10"  (1.778 m)   Wt 169 lb (76.7 kg)   SpO2 98% Comment: on RA  BMI 24.25 kg/m   Physical Exam General: Alert, oriented.  Appears younger than stated age.  Normal weight. HEENT: PERRLA, EOMI CV: Regular rate and rhythm Pulmonary: Lungs clear bilaterally GI: Soft, nontender. MSK: Strength equal bilaterally Psych: Pleasant affect.  Loquacious.      Assessment & Plan:   Problem List Items Addressed This Visit       Endocrine   Hyperparathyroidism ([place])   Relevant Orders   TSH     Other   Encounter to establish care    Patient here to establish primary care physician.  He has done a great job of monitoring and staying on top of his health.  He exercises regularly is at a healthy weight and consumes a healthy diet.  His overarching concern is his mental health and doing everything he can to stay sharp as he gets older.  Is up-to-date on his screenings and received lab work a few days prior to coming to our visit.      Hereditary hemochromatosis ([place])   Hyperlipidemia - Primary   Relevant Orders   Lipid Profile   MCI (mild cognitive impairment)    Return in about 3 months (around 04/18/2023) for well visit, cholesterol.   Benay Pike, MD

## 2023-01-17 ENCOUNTER — Encounter: Payer: Self-pay | Admitting: Family Medicine

## 2023-01-18 ENCOUNTER — Encounter: Payer: Self-pay | Admitting: Family Medicine

## 2023-01-18 DIAGNOSIS — Z7689 Persons encountering health services in other specified circumstances: Secondary | ICD-10-CM | POA: Insufficient documentation

## 2023-01-18 NOTE — Assessment & Plan Note (Signed)
Patient here to establish primary care physician.  He has done a great job of monitoring and staying on top of his health.  He exercises regularly is at a healthy weight and consumes a healthy diet.  His overarching concern is his mental health and doing everything he can to stay sharp as he gets older.  Is up-to-date on his screenings and received lab work a few days prior to coming to our visit.

## 2023-01-22 ENCOUNTER — Other Ambulatory Visit: Payer: Medicare Other

## 2023-01-22 DIAGNOSIS — E213 Hyperparathyroidism, unspecified: Secondary | ICD-10-CM

## 2023-01-22 DIAGNOSIS — E785 Hyperlipidemia, unspecified: Secondary | ICD-10-CM

## 2023-01-23 LAB — LIPID PANEL
Chol/HDL Ratio: 3.9 ratio (ref 0.0–5.0)
Cholesterol, Total: 224 mg/dL — ABNORMAL HIGH (ref 100–199)
HDL: 57 mg/dL (ref >39)
LDL Chol Calc (NIH): 148 mg/dL — ABNORMAL HIGH (ref 0–99)
Triglycerides: 106 mg/dL (ref 0–149)
VLDL Cholesterol Cal: 19 mg/dL (ref 5–40)

## 2023-01-23 LAB — TSH: TSH: 0.901 u[IU]/mL (ref 0.450–4.500)

## 2023-02-21 ENCOUNTER — Telehealth: Payer: Self-pay | Admitting: Internal Medicine

## 2023-02-21 NOTE — Telephone Encounter (Signed)
Patient is calling to say that he would like to schedule a lab appointment so that he can have a "halfway point" for his levels.

## 2023-02-21 NOTE — Telephone Encounter (Signed)
T, He just had labs last month and the month before.  TSH was normal, cholesterol level was still elevated, and his calcium was normal.  I can see him at next visit.

## 2023-02-21 NOTE — Telephone Encounter (Signed)
Pt advised labs from last month are sufficient and we can wait for f/u appt for labs.

## 2023-02-28 ENCOUNTER — Encounter: Payer: Medicare Other | Admitting: Psychology

## 2023-02-28 ENCOUNTER — Telehealth: Payer: Self-pay

## 2023-02-28 NOTE — Telephone Encounter (Signed)
Called patient to schedule Medicare Annual Wellness Visit (AWV). Left message for patient to call back and schedule Medicare Annual Wellness Visit (AWV).  Last date of AWV: eligible 08/22/22 for AWV-I  Please schedule an appointment at any time On Annual Wellness Visit Schedule.    Agnes Lawrence, CMA (AAMA)  [facility]- AWV Program [phone]

## 2023-04-01 ENCOUNTER — Telehealth: Payer: Self-pay | Admitting: Cardiology

## 2023-04-01 NOTE — Telephone Encounter (Signed)
Attempted to reach, busy signal

## 2023-04-01 NOTE — Telephone Encounter (Signed)
Pts wife, Elon Jester, called to get pt scheduled with Dr. Elberta Fortis as he hasn't been seen by him since late 2018. Pts wife didn't advise it pt was having any issues or not, just that he hadn't been seen in a while. Unsure if it needs to go to EP or gen cards.

## 2023-04-05 NOTE — Telephone Encounter (Signed)
Spoke to wife. Pt would like to re-establish with Dr. Elberta Fortis (last seen 2018) for SVT. Aware scheduler will call next week to arrange visit. Wife appreciates the call and agreeable to plan.

## 2023-04-18 ENCOUNTER — Ambulatory Visit (INDEPENDENT_AMBULATORY_CARE_PROVIDER_SITE_OTHER): Payer: Medicare Other | Admitting: Family Medicine

## 2023-04-18 ENCOUNTER — Encounter: Payer: Self-pay | Admitting: Family Medicine

## 2023-04-18 VITALS — BP 105/67 | HR 66 | Temp 98.1°F | Ht 70.0 in | Wt 160.0 lb

## 2023-04-18 DIAGNOSIS — E785 Hyperlipidemia, unspecified: Secondary | ICD-10-CM | POA: Diagnosis not present

## 2023-04-18 DIAGNOSIS — Z0000 Encounter for general adult medical examination without abnormal findings: Secondary | ICD-10-CM

## 2023-04-18 NOTE — Progress Notes (Signed)
   Established Patient Office Visit  Subjective   Patient ID: Carl Dunn, male    DOB: 1950-10-25  Age: 72 y.o. MRN: [id number]  Chief Complaint  Patient presents with   Annual Exam    HPI Pt has been doing well since our last visit.  Has seen his urologist, neurologist, gastroenterologist, and endocrinologist. Had a ultrasound of his liver that was partially obstructed by bowel gas.  Pt asked if there was anything he could do to reduce this.  Discussed that OTC simethicone may have a slight benefit.    Pt continues to get daily exercise in addition to all the exercise he gets from working on his property.  Continues to adhere to a strict diet.  We discussed his cholesterol tests from his last visit.  Since that time the pt has sought out the opinions of some of his specialists regarding whether or not to take a statin.  Also has researched the topic on his own.  At this point he is in favor of not starting a statin, but if he did he would prefer crestor.  I advised him of the AHAs recommendations for primary prevention of CVD.     The 10-year ASCVD risk score (Arnett DK, et al., 2019) is: 13.1%     ROS    Objective:     BP 105/67   Pulse 66   Temp 98.1 F (36.7 C) (Oral)   Ht 5\' 10"  (1.778 m)   Wt 160 lb (72.6 kg)   SpO2 99%   BMI 22.96 kg/m    Physical Exam Gen: alert, oriented Heent: perrla, eomi Cv: rrr, no murmur Pulm: lctab. No wheezes.  Gi: soft, nbs.  Msk: normal gait.  Strength equal b/l Neuro: no FND Psych: pleasant affect.    No results found for any visits on 04/18/23.        Assessment & Plan:   Hyperlipidemia, unspecified hyperlipidemia type Assessment & Plan: Continued ongoing discussion with pt on his cholesterol levels.  I believe he has optimized any non-pharmacological changes he could make to improve his cholesterol levels. Discussed with pt the recommendations from the ACC/AHA for his situation, which includes adding a statin.   Discussed how statins can reduce atherosclerotic plaque formation and how that could reduce his risk of dementia related cognitive impairment.  He wishes to recheck his cholesterol and A1c after making some further dietary adjustments.    Orders: -     Hemoglobin A1c; Future -     Lipid panel; Future  Physical exam, annual     Return in about 4 months (around 08/18/2023).    Sandre Kitty, MD

## 2023-04-18 NOTE — Patient Instructions (Addendum)
It was nice to see you today,  We addressed the following topics today: - make an appointment for your labs.  You should fast for these.   - we will discuss the results with you when we get them.    Have a great day,  Frederic Jericho, MD

## 2023-04-28 NOTE — Assessment & Plan Note (Signed)
Continued ongoing discussion with pt on his cholesterol levels.  I believe he has optimized any non-pharmacological changes he could make to improve his cholesterol levels. Discussed with pt the recommendations from the ACC/AHA for his situation, which includes adding a statin.  Discussed how statins can reduce atherosclerotic plaque formation and how that could reduce his risk of dementia related cognitive impairment.  He wishes to recheck his cholesterol and A1c after making some further dietary adjustments.

## 2023-05-03 ENCOUNTER — Other Ambulatory Visit: Payer: Medicare Other

## 2023-05-09 ENCOUNTER — Other Ambulatory Visit: Payer: Medicare Other

## 2023-05-09 DIAGNOSIS — E785 Hyperlipidemia, unspecified: Secondary | ICD-10-CM

## 2023-05-10 LAB — LIPID PANEL
Chol/HDL Ratio: 3.5 ratio (ref 0.0–5.0)
Cholesterol, Total: 203 mg/dL — ABNORMAL HIGH (ref 100–199)
HDL: 58 mg/dL (ref >39)
LDL Chol Calc (NIH): 133 mg/dL — ABNORMAL HIGH (ref 0–99)
Triglycerides: 65 mg/dL (ref 0–149)
VLDL Cholesterol Cal: 12 mg/dL (ref 5–40)

## 2023-05-10 LAB — HEMOGLOBIN A1C
Est. average glucose Bld gHb Est-mCnc: 114 mg/dL
Hgb A1c MFr Bld: 5.6 % (ref 4.8–5.6)

## 2023-06-11 ENCOUNTER — Ambulatory Visit: Payer: Medicare Other | Attending: Cardiology | Admitting: Cardiology

## 2023-06-11 ENCOUNTER — Encounter: Payer: Self-pay | Admitting: Cardiology

## 2023-06-11 VITALS — BP 96/68 | HR 74 | Ht 70.0 in | Wt 157.2 lb

## 2023-06-11 DIAGNOSIS — E7849 Other hyperlipidemia: Secondary | ICD-10-CM

## 2023-06-11 DIAGNOSIS — I471 Supraventricular tachycardia, unspecified: Secondary | ICD-10-CM

## 2023-06-11 NOTE — Progress Notes (Signed)
  Electrophysiology Office Note:   Date:  06/11/2023  ID:  Carl Dunn, DOB Aug 13, 1951, MRN [id number]  Primary Cardiologist: None Electrophysiologist: None      History of Present Illness:   Carl Dunn is a 72 y.o. male with h/o SVT seen today for  for Electrophysiology evaluation of SVT at the request of Carl Dunn.    The patient reports that he has been doing well.  He has no chest pain or shortness of breath.  He is able to do all his daily activities without restriction.  He has had no further episodes of SVT prior to his prior episode in 2018.  He has been trying to control his cholesterol with diet and exercise.  Despite this, he is quite concerned and would like a coronary calcium scoring.    He has a history of hyperlipidemia, hyperparathyroidism postresection.  In 2018, he had an episode of palpitations while riding his bike.  He was diagnosed with SVT in the emergency room.  He was given adenosine which terminated tachycardia.      Review of systems complete and found to be negative unless listed in HPI.   EP Information / Studies Reviewed:    EKG is ordered today. Personal review as below.  EKG Interpretation Date/Time:  Tuesday June 11 2023 11:33:24 EDT Ventricular Rate:  74 PR Interval:  222 QRS Duration:  98 QT Interval:  398 QTC Calculation: 441 R Axis:   64  Text Interpretation: Sinus rhythm with 1st degree A-V block with occasional Premature ventricular complexes No previous ECGs available Confirmed by Elma Limas ([id number]) on 06/11/2023 11:38:25 AM     Risk Assessment/Calculations:              Physical Exam:   VS:  BP 96/68 (BP Location: Left Arm, Patient Position: Sitting, Cuff Size: Normal)   Pulse 74   Ht 5\' 10"  (1.778 m)   Wt 157 lb 3.2 oz (71.3 kg)   SpO2 97%   BMI 22.56 kg/m    Wt Readings from Last 3 Encounters:  06/11/23 157 lb 3.2 oz (71.3 kg)  04/18/23 160 lb (72.6 kg)  01/16/23 169 lb (76.7 kg)     GEN: Well nourished, well  developed in no acute distress NECK: No JVD; No carotid bruits CARDIAC: Regular rate and rhythm, no murmurs, rubs, gallops RESPIRATORY:  Clear to auscultation without rales, wheezing or rhonchi  ABDOMEN: Soft, non-tender, non-distended EXTREMITIES:  No edema; No deformity   ASSESSMENT AND PLAN:    1.  SVT: Adenosine responsive.  Has had no further episodes since 2018.  Currently feeling well.  Denisse Whitenack continue with current management.  2.  Hyperlipidemia: Elevated LDL cholesterol with elevated total cholesterol.  Patient would like to try and avoid statins if possible.  Taijon Vink plan for coronary calcium score.  Follow up with Dr. Elberta Dunn  PRN   Signed, Serra Younan Jorja Loa, MD

## 2023-06-11 NOTE — Patient Instructions (Addendum)
Medication Instructions:  Your physician recommends that you continue on your current medications as directed. Please refer to the Current Medication list given to you today.  *If you need a refill on your cardiac medications before your next appointment, please call your pharmacy*   Lab Work: None ordered   Testing/Procedures: Your physician has ordered a coronary calcium score study (self-pay)   Follow-Up: At [facility], you and your health needs are our priority.  As part of our continuing mission to provide you with exceptional heart care, we have created designated Provider Care Teams.  These Care Teams include your primary Cardiologist (physician) and Advanced Practice Providers (APPs -  Physician Assistants and Nurse Practitioners) who all work together to provide you with the care you need, when you need it.   Your next appointment:   To be  determined after calcium score testing  The format for your next appointment:   In Person  Provider:   Loman Brooklyn, MD{  Thank you for choosing [facility]!!   Dory Horn, RN [phone]

## 2023-06-12 ENCOUNTER — Ambulatory Visit (HOSPITAL_COMMUNITY)
Admission: RE | Admit: 2023-06-12 | Discharge: 2023-06-12 | Disposition: A | Discharge status: Home / Self Care | Payer: Medicare Other | Source: Ambulatory Visit | Attending: Cardiology | Admitting: Cardiology

## 2023-06-12 DIAGNOSIS — E7849 Other hyperlipidemia: Secondary | ICD-10-CM | POA: Insufficient documentation

## 2023-06-14 ENCOUNTER — Telehealth: Payer: Self-pay | Admitting: Cardiology

## 2023-06-14 MED ORDER — ROSUVASTATIN CALCIUM 20 MG PO TABS: 20.0000 mg | ORAL_TABLET | Freq: Every day | ORAL | 3 refills | Status: AC

## 2023-06-14 NOTE — Telephone Encounter (Signed)
Prescription has been sent in and patient is aware.  

## 2023-06-14 NOTE — Telephone Encounter (Signed)
Carl Jorja Loa, MD 06/14/2023 12:55 PM EDT     Elevated coronary calcium score.  LDL is elevated.  Start Crestor 40 mg daily.    The patient has been notified of the result and verbalized understanding.  All questions (if any) were answered. Frutoso Schatz, RN 06/14/2023 3:06 PM    Patient would like to know if he can start on a lower dose of crestor. Patient states that he is very active and is working on his diet. He would prefer to start on a lower dose to see if he can tolerate it and if it Carl bring his LDL down along with his lifestyle changes. Carl send to Dr. Elberta Fortis to advise.

## 2023-06-14 NOTE — Telephone Encounter (Signed)
  Pt's wife calling to get calcium score result and if the pt needs to see Dr. Elberta Fortis for follow up visit

## 2023-06-26 ENCOUNTER — Ambulatory Visit: Payer: Medicare Other | Admitting: Adult Health

## 2023-07-01 ENCOUNTER — Ambulatory Visit: Payer: Medicare Other | Admitting: Internal Medicine

## 2023-07-17 ENCOUNTER — Ambulatory Visit: Payer: Medicare Other | Admitting: Adult Health

## 2023-07-29 ENCOUNTER — Encounter: Payer: Self-pay | Admitting: Internal Medicine

## 2023-07-29 ENCOUNTER — Ambulatory Visit: Payer: Medicare Other | Admitting: Internal Medicine

## 2023-07-29 VITALS — BP 116/70 | HR 89 | Ht 70.0 in | Wt 165.6 lb

## 2023-07-29 DIAGNOSIS — E042 Nontoxic multinodular goiter: Secondary | ICD-10-CM

## 2023-07-29 DIAGNOSIS — Z8639 Personal history of other endocrine, nutritional and metabolic disease: Secondary | ICD-10-CM

## 2023-07-29 DIAGNOSIS — R7309 Other abnormal glucose: Secondary | ICD-10-CM

## 2023-07-29 DIAGNOSIS — R634 Abnormal weight loss: Secondary | ICD-10-CM

## 2023-07-29 DIAGNOSIS — Z9889 Other specified postprocedural states: Secondary | ICD-10-CM

## 2023-07-29 NOTE — Patient Instructions (Addendum)
Please stop at the lab.  Please schedule an appt with nutrition - [phone].  You can continue to follow up with PCP from now.

## 2023-07-29 NOTE — Progress Notes (Unsigned)
Patient ID: Carl Dunn, male   DOB: December 16, 1950, 72 y.o.   MRN: [id number]   HPI  Carl Dunn is a 72 y.o.-year-old male, returning for follow-up for thyroid nodules, h/o left hemithyroidectomy, and h/o primary hyperparathyroidism.  He is the husband of Carl Dunn, who is also my patient.  Last visit 1 year and 2 months ago.  Interim history: He was recently diagnosed with cognitive impairment.  He had spinal tap in 05/2023 and this returned positive for elevated tau protein. Of note, he has hereditary hemochromatosis and has therapeutic phlebotomies.  He is still on the [employer] with 16-8h fasting-eating window. He lost >10 lbs since last OV >> he now started to try to gain muscle. He walks 5 mi a day and bikes in the pm.  He would be interested in a referral to nutrition.  Thyroid nodules: - dx'ed in ~2014 during investigation for primary hyperparathyroidism in the setting of kidney stones.  - The nodules appeared to have been stable over time.  He had serial ultrasounds with previous endocrinologist.   - On the latest ultrasound from 02/2018, no thyroid nodules were seen  As mentioned above, he also has a history of hyperparathyroidism and had parathyroidectomy in 2014 (2 glands removed - Dr. Barbie Haggis).  At that time, a left nodule was resected (left subtotal thyroidectomy). Subsequently, he had 2 benign biopsies of his dominant right thyroid nodules.  Thyroid U/S (02/12/2017): Overall the gland is heterogeneous in echotexture consistent with nontoxic  multinodular goiter.  The isthmus measures: 0.47 cm, previously 0.57 cm, previously 0.40 cm.  The right lobe measures: 1.63 x 1.96 x 4.01 cm, previously 1.85 x 2.01 x  3.27 cm, previously 1.64 x 2.2 x 4.94 cm.  The left lobe measures: 1.75 x 1.43 x 3.20 cm, previously 1.14 x 1.26 x  2.46 cm, previously 1.3 x 1.28 x 3.13 cm.  Partial left hemithyroidectomy performed at parathyroid surgery for left sided nodule.     Nodules:  Right  Nodule 1.  There is a spongiform appearing nodule in the superior  posterior lobe that measures 1.07 x 0.92 x 1.26 cm, perviously  1.42 x  1.57 x 1.60 cm, previously 1.28 x 1.31 x 1.61 cm, previously 1.35 x 1.46 x  1.53 cm. This nodule is stable and has previously been biopsied and  benign. There is peripheral and minimal internal flow by Doppler. Stable.     Right Nodule 3.  Inferior and medial there is a spongiform appearing  nodules that measures 1.33 x 1.65 x 1.67 cm, previously 1.41 x 1.25 x 2.09  cm, previously 1.4 x 2.06 x 1.69 cm, previously 1.81 x 2.05 x 1.60 cm. This nodule is stable. There is peripheral flow and minimal internal  vascularity.  There is a linear calcification internally in the nodule with posterior acoustic dropout measuring 0.28 cm, previously 0.33 cm, similar to previous at 0.26 cm.  This nodule has previously been sampled and benign.     Left Nodule 1.  Surgically removed at the time of parathyroidectomy. No additional left sided thyroid nodules.     Lymph Node Assessment:  There are no abnormal lymph nodes seen in the left or right neck levels  II-VI including VA/VB     No parathyroid candidates seen, resolved.     Assessment and Plan:  1. Stable multinodular goiter as above.  2. Disposition. Repeat US in 12-24 months as needed   Thyroid U/S (02/24/2018, [facility]) - Carl Dunn: Carl Dunn  thyroid without nodules, but with some heterogeneous areas.   Pt denies: - feeling nodules in neck - hoarseness - dysphagia - choking  I reviewed pt's thyroid tests - he has a history of low TSH in 2019, which resolved: 01/22/2023: TSH 0.901 08/23/2022: TSH 0.792 06/27/2022: TSH 0.86, total T4 9.0 (5.5-11.8) 06/01/2022: TSH 0.63 Lab Results  Component Value Date   TSH 0.901 01/22/2023   TSH 0.792 08/23/2022   TSH 0.75 06/27/2021   TSH 1.02 02/10/2021   FREET4 0.76 06/27/2021   FREET4 0.72 02/10/2021  12/06/2020: TSH 0.461, normal 02/24/2018: TSH slightly low, at  0.317  He describes that in the past he was hypothyroid and prescribed Synthroid, but later stopped.  No FH of thyroid ds. No FH of thyroid cancer. No h/o radiation tx to head or neck.  She was on kelp before last visit, which was stopped. No steroid use. + Several supplements including DHEA, prostate supplement, zinc, copper.  + Biotin supplements -B complex.  Pt also has a history of primary hyperparathyroidism, for which he had parathyroidectomy in 2014.  PTH decreased from 121 before surgery to 20 after surgery.  Reviewed most recent pertinent labs: No results found for: "PTH"   Lab Results  Component Value Date   CALCIUM 9.0 08/23/2022   CALCIUM 9.0 10/23/2016  06/01/2022: Calcium 8.5 (8.5-10.5) 06/14/2021: Calcium 9.3 02/10/2021: Ionized calcium 4.64 (4.8-5.6) 12/06/2020: Calcium 8.2 (8.5-10.5) 08/08/2017: Calcium 9.1, PTH 28  Lab Results  Component Value Date   VD25OH 45.4 06/27/2021   VD25OH 69.1 12/29/2020  06/27/2022: Vitamin D 42 03/06/2016: Vitamin D 38 08/01/2015: Vitamin D 36 He was on vitamin D 5800 units daily >> but varying the dose.   He has a history of elevated coronary calcium score (1022), SVT, and  hyperlipidemia -managed by cardiology: Lab Results  Component Value Date   CHOL 203 (H) 05/09/2023   HDL 58 05/09/2023   LDLCALC 133 (H) 05/09/2023   TRIG 65 05/09/2023   CHOLHDL 3.5 05/09/2023  06/27/2022: 191/122/59/122 Previously on Lipitor 20 mg 3 times a week as he did not like how she felt on daily dose.  This was changed to Crestor 20 mg daily, but he is not taking this.  Latest HbA1c: Lab Results  Component Value Date   HGBA1C 5.6 05/09/2023   HGBA1C 5.7 (H) 08/23/2022   He also has a history of hereditary hemochromatosis.  He previously had regular phlebotomies. he is AST was high, but per review of previous abdominal imaging, there were no masses in his liver. He had stage 1a melanoma (leg). He also has a history of gout  He is on Clomid 25 mg  daily.  Testosterone from 12/26/2020 was normal. He saw Dr. Patsi Sears with urology. He has B12 deficiency and is on B12 injections. She also has a history of OSA.  ROS: + See HPI  I reviewed pt's medications, allergies, PMH, social hx, family hx, and changes were documented in the history of present illness. Otherwise, unchanged from my initial visit note.  Past Medical History:  Diagnosis Date   Anaphylaxis    Hyperlipidemia    Melanoma (HCC)    Nephrolithiasis 06/28/2021   Past Surgical History:  Procedure Laterality Date   MELANOMA EXCISION     THYROID SURGERY     Social History   Socioeconomic History   Marital status: Married    Spouse name: Not on file   Number of children: 0   Years of education: Not on file  Highest education level: Master's degree (e.g., MA, MS, MEng, MEd, MSW, MBA)  Occupational History   Occupation: Retired former Market researcher  Tobacco Use   Smoking status: Never   Smokeless tobacco: Never  Substance and Sexual Activity   Alcohol use: No   Drug use: No   Sexual activity: Yes    Birth control/protection: None  Other Topics Concern   Not on file  Social History Narrative   Not on file   Social Determinants of Health   Financial Resource Strain: Not on file  Food Insecurity: Low Risk  (01/06/2023)   [facility]   Hunger Vital Sign    Worried About Running Out of Food in the Last Year: Never true    Ran Out of Food in the Last Year: Never true  Transportation Needs: No Transportation Needs (01/06/2023)   [facility]   Transportation    In the past 12 months, has lack of reliable transportation kept you from medical appointments, meetings, work or from getting things needed for daily living? : No  Physical Activity: Not on file  Stress: Not on file  Social Connections: Not on file  Intimate Partner Violence: Not on file   Current Outpatient Medications on File Prior to  Visit  Medication Sig Dispense Refill   Cholecalciferol (VITAMIN D PO) Take 1 tablet by mouth daily.     Cyanocobalamin (VITAMIN B 12 PO) Take 1 tablet by mouth as needed.     EPINEPHrine (EPIPEN) 0.3 mg/0.3 mL DEVI Inject 0.3 mLs (0.3 mg total) into the muscle as needed (anaphylaxis). 2 Device 1   Iodine, Kelp, (KELP PO) Take by mouth.     MAGNESIUM PO Take 1 tablet by mouth daily.     Multiple Vitamins-Minerals (ZINC PO) Take 1 tablet by mouth daily.     rosuvastatin (CRESTOR) 20 MG tablet Take 1 tablet (20 mg total) by mouth daily. 90 tablet 3   No current facility-administered medications on file prior to visit.   No Known Allergies Family History  Problem Relation Age of Onset   Hypoparathyroidism Mother        patient   Cancer Mother    Hyperlipidemia Father    Colon cancer Father    Hypertension Father    Cancer Father    Heart attack Paternal Grandfather    PE: BP 116/70   Pulse 89   Ht 5\' 10"  (1.778 m)   Wt 165 lb 9.6 oz (75.1 kg)   SpO2 99%   BMI 23.76 kg/m  Wt Readings from Last 3 Encounters:  07/29/23 165 lb 9.6 oz (75.1 kg)  06/11/23 157 lb 3.2 oz (71.3 kg)  04/18/23 160 lb (72.6 kg)   Constitutional: normal weight, in NAD Eyes: EOMI, no exophthalmos ENT: no thyromegaly, no cervical lymphadenopathy Cardiovascular: RRR, No MRG Respiratory: CTA B Musculoskeletal: no deformities Skin: no rashes Neurological: no tremor with outstretched hands  ASSESSMENT: 1. H/o Thyroid nodules  2. H/o primary hyperparathyroidism - s/p parathyroidectomy  3.  Low serum calcium  4.  History of a low TSH  5.  Excessive weight loss  6.  Elevated HbA1c  7. Hyperlipidemia -Per cardiology  PLAN: 1. H/o Thyroid nodules -Patient has a history of several thyroid nodules, but latest thyroid ultrasound showed a small thyroid without detectable nodules.  He previously had left partial lobectomy at the time of his parathyroidectomy in 2014, during which one of the nodules  was removed.  2 of the other nodules in the right thyroid lobe have been biopsied in the past with benign results.  These have been stable since 2016 2019, as mentioned above, however, not clearly detectable on the ultrasound from 2019.  We discussed that these could have been just areas of inflammation (pseudo nodules). -She has not family history of thyroid cancer or personal history of radiation therapy to head or neck to increase his risk of thyroid cancer. -No  felt on palpation of his neck and no neck compression symptoms.  He does have occasional postnasal drip due to allergies. -No further investigation is needed for this  2.  History of primary hyperparathyroidism and 3.  Low serum calcium -Previously seen at [facility] -The abnormal parathyroid glands were not visualized before the surgery so he had exploratory parathyroidectomy during which 2 glands were resected (2014) -He had a slightly low calcium afterwards (total and ionized)-latest in 11/2020: Ionized calcium 4.64 (4.8-5.6).  -Latest total calcium level was normal on 06/12/2023: 9.5 (8.6-10.3) -Most recent vitamin D level was normal: 06/27/2022: Vitamin D 42 Lab Results  Component Value Date   VD25OH 45.4 06/27/2021  -We will recheck a vitamin D level today  4.  History of a low TSH -Continue to thousand 19, he had a mildly low TSH, 0.3; this could have been related to kelp or biotin use -TSH levels were repeatedly normal afterwards, with the latest being on 01/22/2023: 0.901 -No further investigation needed for this  5.  Excessive weight loss -Patient is eating a clean low-carb diet and exercises a significant amount during the day.  He is worried that he is still losing muscle.  He would like a referral to nutrition.  Referral placed today.  6.  Elevated HbA1c -Patient is very worried about a previously slightly elevated HbA1c, at 5.7%.  The latest level was 5.6%, improved.  I advised him that at these low levels, the HbA1c  acid is not very accurate.  Due to his diet and exercise routines, I advised him not to worry about this and have the HbA1c checked once a year at his annual physical exam.  At today's visit, we discussed that I do not feel that he needs to follow-up with endocrinology anymore.  He, can follow-up with his PCP.  - Total time spent for the visit: 40 min, in precharting, postcharting, reviewing obtaining medical information from the chart including Epic and  Care everywhere records and also records from the pt, reviewing his  previous labs, evaluations, and treatments, reviewing his symptoms, counseling him about his above problems (please see the discussed topics above); he had a number of questions which I addressed   Carl Pavlov, MD PhD [facility]

## 2023-07-30 ENCOUNTER — Encounter: Payer: Self-pay | Admitting: Internal Medicine

## 2023-07-30 LAB — VITAMIN D 25 HYDROXY (VIT D DEFICIENCY, FRACTURES): VITD: 54.58 ng/mL (ref 30.00–100.00)

## 2023-08-20 ENCOUNTER — Ambulatory Visit: Payer: Medicare Other | Admitting: Family Medicine

## 2024-01-09 ENCOUNTER — Ambulatory Visit: Payer: Medicare Other | Admitting: Psychology

## 2024-01-29 ENCOUNTER — Ambulatory Visit: Payer: Medicare Other | Admitting: Adult Health
